# Patient Record
Sex: Female | Born: 1960 | Race: White | Hispanic: No | Smoking: Never smoker
Health system: Southern US, Community
[De-identification: ages and names within clinical notes are randomized; demographics above are authoritative.]

## PROBLEM LIST (undated history)

## (undated) DIAGNOSIS — F419 Anxiety disorder, unspecified: Secondary | ICD-10-CM

## (undated) DIAGNOSIS — Z808 Family history of malignant neoplasm of other organs or systems: Secondary | ICD-10-CM

## (undated) DIAGNOSIS — Z8489 Family history of other specified conditions: Secondary | ICD-10-CM

## (undated) DIAGNOSIS — Z8042 Family history of malignant neoplasm of prostate: Secondary | ICD-10-CM

## (undated) DIAGNOSIS — Z923 Personal history of irradiation: Secondary | ICD-10-CM

## (undated) DIAGNOSIS — E039 Hypothyroidism, unspecified: Secondary | ICD-10-CM

## (undated) HISTORY — DX: Family history of malignant neoplasm of prostate: Z80.42

## (undated) HISTORY — DX: Family history of malignant neoplasm of other organs or systems: Z80.8

---
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
## 1998-09-01 ENCOUNTER — Inpatient Hospital Stay (HOSPITAL_COMMUNITY): Admission: AD | Admit: 1998-09-01 | Discharge: 1998-09-03 | Payer: Self-pay | Admitting: Obstetrics and Gynecology

## 2001-02-26 ENCOUNTER — Other Ambulatory Visit: Admission: RE | Admit: 2001-02-26 | Discharge: 2001-02-26 | Payer: Self-pay | Admitting: Obstetrics and Gynecology

## 2002-01-20 ENCOUNTER — Encounter: Admission: RE | Admit: 2002-01-20 | Discharge: 2002-01-20 | Payer: Self-pay | Admitting: Obstetrics and Gynecology

## 2002-01-20 ENCOUNTER — Encounter: Payer: Self-pay | Admitting: Obstetrics and Gynecology

## 2002-03-18 HISTORY — PX: TUBAL LIGATION: SHX77

## 2002-09-03 ENCOUNTER — Ambulatory Visit (HOSPITAL_COMMUNITY): Admission: RE | Admit: 2002-09-03 | Discharge: 2002-09-03 | Payer: Self-pay | Admitting: Obstetrics and Gynecology

## 2003-05-17 ENCOUNTER — Encounter: Admission: RE | Admit: 2003-05-17 | Discharge: 2003-05-17 | Payer: Self-pay | Admitting: Obstetrics and Gynecology

## 2005-02-27 ENCOUNTER — Encounter: Admission: RE | Admit: 2005-02-27 | Discharge: 2005-02-27 | Payer: Self-pay | Admitting: Obstetrics and Gynecology

## 2006-03-27 ENCOUNTER — Encounter: Admission: RE | Admit: 2006-03-27 | Discharge: 2006-03-27 | Payer: Self-pay | Admitting: Obstetrics and Gynecology

## 2006-04-04 ENCOUNTER — Encounter: Admission: RE | Admit: 2006-04-04 | Discharge: 2006-04-04 | Payer: Self-pay | Admitting: Obstetrics and Gynecology

## 2006-10-06 ENCOUNTER — Encounter: Admission: RE | Admit: 2006-10-06 | Discharge: 2006-10-06 | Payer: Self-pay | Admitting: Obstetrics and Gynecology

## 2007-04-14 ENCOUNTER — Encounter: Admission: RE | Admit: 2007-04-14 | Discharge: 2007-04-14 | Payer: Self-pay | Admitting: Obstetrics and Gynecology

## 2007-09-16 ENCOUNTER — Encounter: Admission: RE | Admit: 2007-09-16 | Discharge: 2007-09-16 | Payer: Self-pay | Admitting: Obstetrics and Gynecology

## 2008-04-14 ENCOUNTER — Encounter: Admission: RE | Admit: 2008-04-14 | Discharge: 2008-04-14 | Payer: Self-pay | Admitting: Obstetrics and Gynecology

## 2009-04-25 ENCOUNTER — Encounter: Admission: RE | Admit: 2009-04-25 | Discharge: 2009-04-25 | Payer: Self-pay | Admitting: Obstetrics and Gynecology

## 2010-04-08 ENCOUNTER — Encounter: Payer: Self-pay | Admitting: Obstetrics and Gynecology

## 2010-04-11 ENCOUNTER — Other Ambulatory Visit: Payer: Self-pay | Admitting: Obstetrics and Gynecology

## 2010-04-11 DIAGNOSIS — Z1239 Encounter for other screening for malignant neoplasm of breast: Secondary | ICD-10-CM

## 2010-05-07 ENCOUNTER — Ambulatory Visit
Admission: RE | Admit: 2010-05-07 | Discharge: 2010-05-07 | Disposition: A | Discharge status: Home / Self Care | Payer: BC Managed Care – PPO | Source: Ambulatory Visit | Attending: Obstetrics and Gynecology | Admitting: Obstetrics and Gynecology

## 2010-05-07 DIAGNOSIS — Z1239 Encounter for other screening for malignant neoplasm of breast: Secondary | ICD-10-CM

## 2010-08-03 NOTE — H&P (Signed)
   NAMERAENA, PAU                       ACCOUNT NO.:  [id number]   MEDICAL RECORD NO.:  [id number]                   PATIENT TYPE:  AMB   LOCATION:  [facility]                                  FACILITY:  [facility]   PHYSICIAN:  Lenoard Aden, M.D.             DATE OF BIRTH:  31-Aug-1960   DATE OF ADMISSION:  09/03/2002  DATE OF DISCHARGE:                                HISTORY & PHYSICAL   CHIEF COMPLAINT:  Desire for elective sterilization.   HISTORY OF PRESENT ILLNESS:  This is a 50 year old, white female, G1, P1 who  desires elective sterilization.   PAST MEDICAL HISTORY:  1. Spontaneous vaginal delivery x1.  2. History of knee surgery in 1979.   ALLERGIES:  No known drug allergies.   MEDICATIONS:  None.   FAMILY HISTORY:  Noncontributory.   SOCIAL HISTORY:  Noncontributory.  Denies physical violence.   PHYSICAL EXAMINATION:  GENERAL:  She is a well-developed, well-nourished,  white female in no acute distress.  HEENT:  Normal.  LUNGS:  Clear.  HEART:  Regular rhythm.  ABDOMEN:  Soft, nontender.  PELVIC:  Small uterus and no adnexal masses.  EXTREMITIES:  No cords.  NEUROLOGIC:  Nonfocal.   IMPRESSION:  Desire for elective sterilization.   PLAN:  Proceed with a laparoscopic tubal ligation.  Risks of anesthesia,  infection, bleeding, injury to abdominal organs and need for repairs was  discussed.  Delayed versus immediate complications to include bowel and  bladder injury noted, risk of tubal ligation.  The patient acknowledges and  wishes to proceed.                                               Lenoard Aden, M.D.    RJT/MEDQ  D:  09/02/2002  T:  09/03/2002  Job:  [id number]

## 2010-08-03 NOTE — Op Note (Signed)
   NAMEARDA, DAGGS                       ACCOUNT NO.:  [id number]   MEDICAL RECORD NO.:  [id number]                   PATIENT TYPE:  AMB   LOCATION:  [facility]                                  FACILITY:  [facility]   PHYSICIAN:  Lenoard Aden, M.D.             DATE OF BIRTH:  1960-10-04   DATE OF PROCEDURE:  09/03/2002  DATE OF DISCHARGE:                                 OPERATIVE REPORT   PREOPERATIVE DIAGNOSIS:  Elective tubal ligation.   POSTOPERATIVE DIAGNOSIS:  Elective tubal ligation.   PROCEDURE:  Laparoscopic tubal ligation.   SURGEON:  Lenoard Aden, M.D.   ANESTHESIA:  General.   ESTIMATED BLOOD LOSS:  Less than 50 mL.   COMPLICATIONS:  None.   DRAINS:  None.   COUNTS:  Correct.   Patient to recovery room in good condition.   BRIEF OPERATIVE NOTE:  After being apprised of the risks of anesthesia,  infection, bleeding, failure risk of tubal ligation of five to 10 per  thousand, the patient is brought to the operating room after consents are  signed, prepped and draped in the usual sterile fashion.  After achieving  general anesthesia, examination under anesthesia reveals a mid positioned  uterus, no adnexal masses.  Hulka tenaculum placed per vagina,  infraumbilical incision made with a scalpel after placement of 10 mL of a  dilute Marcaine solution.  Veress needle placed.  Opening pressure of -2  noted.  Five liters of CO2 insufflated without difficulty, trocar placed and  atraumatic trocar placement visualized, pictures taken, normal liver,  gallbladder bed, normal appendiceal bed visualized, pictures taken, normal  tubes, normal ovaries, normal anterior and posterior cul-de-sac noted,  pictures taken.  Right tube traced out to the fimbriated end, cauterized  using Kleppinger bipolar cautery down to a resistance of 0 on three  contiguous segments.  The same procedure done on the left tube, both tubes  divided using hook scissors, tubal lumens visualized.   Marcaine solution  sprayed on both tubes approximately 10 mL total.  Good hemostasis noted.  CO2 released.  All instruments removed under direct visualization and  closure of abdominal incision using 0 Vicryl and Dermabond, removal of Hulka  tenaculum___________ is performed.  The patient tolerates the procedure  well, to recovery in good condition.                                               Lenoard Aden, M.D.   RJT/MEDQ  D:  09/03/2002  T:  09/03/2002  Job:  [id number]

## 2011-03-27 ENCOUNTER — Other Ambulatory Visit: Payer: Self-pay | Admitting: Obstetrics and Gynecology

## 2011-03-27 DIAGNOSIS — Z1231 Encounter for screening mammogram for malignant neoplasm of breast: Secondary | ICD-10-CM

## 2011-05-09 ENCOUNTER — Ambulatory Visit: Payer: BC Managed Care – PPO

## 2011-05-30 ENCOUNTER — Ambulatory Visit
Admission: RE | Admit: 2011-05-30 | Discharge: 2011-05-30 | Disposition: A | Discharge status: Home / Self Care | Payer: BC Managed Care – PPO | Source: Ambulatory Visit | Attending: Obstetrics and Gynecology | Admitting: Obstetrics and Gynecology

## 2011-05-30 DIAGNOSIS — Z1231 Encounter for screening mammogram for malignant neoplasm of breast: Secondary | ICD-10-CM

## 2012-03-20 ENCOUNTER — Other Ambulatory Visit: Payer: Self-pay | Admitting: Obstetrics and Gynecology

## 2012-03-20 DIAGNOSIS — Z1231 Encounter for screening mammogram for malignant neoplasm of breast: Secondary | ICD-10-CM

## 2012-06-01 ENCOUNTER — Ambulatory Visit
Admission: RE | Admit: 2012-06-01 | Discharge: 2012-06-01 | Disposition: A | Discharge status: Home / Self Care | Payer: BC Managed Care – PPO | Source: Ambulatory Visit | Attending: Obstetrics and Gynecology | Admitting: Obstetrics and Gynecology

## 2012-06-01 DIAGNOSIS — Z1231 Encounter for screening mammogram for malignant neoplasm of breast: Secondary | ICD-10-CM

## 2013-04-08 ENCOUNTER — Other Ambulatory Visit: Payer: Self-pay

## 2013-04-08 DIAGNOSIS — Z1231 Encounter for screening mammogram for malignant neoplasm of breast: Secondary | ICD-10-CM

## 2013-06-02 ENCOUNTER — Ambulatory Visit
Admission: RE | Admit: 2013-06-02 | Discharge: 2013-06-02 | Disposition: A | Discharge status: Home / Self Care | Payer: BC Managed Care – PPO | Source: Ambulatory Visit

## 2013-06-02 DIAGNOSIS — Z1231 Encounter for screening mammogram for malignant neoplasm of breast: Secondary | ICD-10-CM

## 2014-05-18 ENCOUNTER — Other Ambulatory Visit: Payer: Self-pay

## 2014-05-18 DIAGNOSIS — Z1231 Encounter for screening mammogram for malignant neoplasm of breast: Secondary | ICD-10-CM

## 2014-06-07 ENCOUNTER — Other Ambulatory Visit: Payer: Self-pay | Admitting: Obstetrics and Gynecology

## 2014-06-07 ENCOUNTER — Ambulatory Visit
Admission: RE | Admit: 2014-06-07 | Discharge: 2014-06-07 | Disposition: A | Discharge status: Home / Self Care | Payer: BLUE CROSS/BLUE SHIELD | Source: Ambulatory Visit

## 2014-06-07 DIAGNOSIS — R928 Other abnormal and inconclusive findings on diagnostic imaging of breast: Secondary | ICD-10-CM

## 2014-06-07 DIAGNOSIS — Z1231 Encounter for screening mammogram for malignant neoplasm of breast: Secondary | ICD-10-CM

## 2014-06-09 ENCOUNTER — Ambulatory Visit
Admission: RE | Admit: 2014-06-09 | Discharge: 2014-06-09 | Disposition: A | Discharge status: Home / Self Care | Payer: BLUE CROSS/BLUE SHIELD | Source: Ambulatory Visit | Attending: Obstetrics and Gynecology | Admitting: Obstetrics and Gynecology

## 2014-06-09 DIAGNOSIS — R928 Other abnormal and inconclusive findings on diagnostic imaging of breast: Secondary | ICD-10-CM

## 2015-08-21 DIAGNOSIS — N915 Oligomenorrhea, unspecified: Secondary | ICD-10-CM | POA: Diagnosis not present

## 2015-10-06 DIAGNOSIS — Z13220 Encounter for screening for lipoid disorders: Secondary | ICD-10-CM | POA: Diagnosis not present

## 2015-10-06 DIAGNOSIS — Z1211 Encounter for screening for malignant neoplasm of colon: Secondary | ICD-10-CM | POA: Diagnosis not present

## 2015-10-06 DIAGNOSIS — Z0000 Encounter for general adult medical examination without abnormal findings: Secondary | ICD-10-CM | POA: Diagnosis not present

## 2016-06-25 DIAGNOSIS — Z1231 Encounter for screening mammogram for malignant neoplasm of breast: Secondary | ICD-10-CM | POA: Diagnosis not present

## 2016-06-25 DIAGNOSIS — Z6834 Body mass index (BMI) 34.0-34.9, adult: Secondary | ICD-10-CM | POA: Diagnosis not present

## 2016-06-25 DIAGNOSIS — Z01419 Encounter for gynecological examination (general) (routine) without abnormal findings: Secondary | ICD-10-CM | POA: Diagnosis not present

## 2017-05-01 DIAGNOSIS — H8309 Labyrinthitis, unspecified ear: Secondary | ICD-10-CM | POA: Diagnosis not present

## 2017-07-11 DIAGNOSIS — Z1231 Encounter for screening mammogram for malignant neoplasm of breast: Secondary | ICD-10-CM | POA: Diagnosis not present

## 2017-07-11 DIAGNOSIS — Z6835 Body mass index (BMI) 35.0-35.9, adult: Secondary | ICD-10-CM | POA: Diagnosis not present

## 2017-07-11 DIAGNOSIS — Z01419 Encounter for gynecological examination (general) (routine) without abnormal findings: Secondary | ICD-10-CM | POA: Diagnosis not present

## 2017-07-11 DIAGNOSIS — Z1151 Encounter for screening for human papillomavirus (HPV): Secondary | ICD-10-CM | POA: Diagnosis not present

## 2017-12-28 DIAGNOSIS — H1033 Unspecified acute conjunctivitis, bilateral: Secondary | ICD-10-CM | POA: Diagnosis not present

## 2018-12-31 DIAGNOSIS — Z23 Encounter for immunization: Secondary | ICD-10-CM | POA: Diagnosis not present

## 2020-02-07 DIAGNOSIS — Z1231 Encounter for screening mammogram for malignant neoplasm of breast: Secondary | ICD-10-CM | POA: Diagnosis not present

## 2020-02-07 DIAGNOSIS — Z6834 Body mass index (BMI) 34.0-34.9, adult: Secondary | ICD-10-CM | POA: Diagnosis not present

## 2020-02-07 DIAGNOSIS — Z1151 Encounter for screening for human papillomavirus (HPV): Secondary | ICD-10-CM | POA: Diagnosis not present

## 2020-02-07 DIAGNOSIS — Z01419 Encounter for gynecological examination (general) (routine) without abnormal findings: Secondary | ICD-10-CM | POA: Diagnosis not present

## 2020-02-17 ENCOUNTER — Other Ambulatory Visit: Payer: Self-pay | Admitting: Obstetrics and Gynecology

## 2020-02-17 DIAGNOSIS — R921 Mammographic calcification found on diagnostic imaging of breast: Secondary | ICD-10-CM

## 2020-02-17 DIAGNOSIS — R928 Other abnormal and inconclusive findings on diagnostic imaging of breast: Secondary | ICD-10-CM

## 2020-03-02 ENCOUNTER — Other Ambulatory Visit: Payer: Self-pay | Admitting: Obstetrics and Gynecology

## 2020-03-02 ENCOUNTER — Other Ambulatory Visit: Payer: Self-pay

## 2020-03-02 ENCOUNTER — Ambulatory Visit
Admission: RE | Admit: 2020-03-02 | Discharge: 2020-03-02 | Disposition: A | Discharge status: Home / Self Care | Payer: BC Managed Care – PPO | Source: Ambulatory Visit | Attending: Obstetrics and Gynecology | Admitting: Obstetrics and Gynecology

## 2020-03-02 DIAGNOSIS — R928 Other abnormal and inconclusive findings on diagnostic imaging of breast: Secondary | ICD-10-CM

## 2020-03-02 DIAGNOSIS — R921 Mammographic calcification found on diagnostic imaging of breast: Secondary | ICD-10-CM | POA: Diagnosis not present

## 2020-03-21 ENCOUNTER — Ambulatory Visit
Admission: RE | Admit: 2020-03-21 | Discharge: 2020-03-21 | Disposition: A | Discharge status: Home / Self Care | Payer: BC Managed Care – PPO | Source: Ambulatory Visit | Attending: Obstetrics and Gynecology | Admitting: Obstetrics and Gynecology

## 2020-03-21 ENCOUNTER — Other Ambulatory Visit: Payer: Self-pay

## 2020-03-21 DIAGNOSIS — N6092 Unspecified benign mammary dysplasia of left breast: Secondary | ICD-10-CM | POA: Diagnosis not present

## 2020-03-21 DIAGNOSIS — R928 Other abnormal and inconclusive findings on diagnostic imaging of breast: Secondary | ICD-10-CM

## 2020-03-21 DIAGNOSIS — C50919 Malignant neoplasm of unspecified site of unspecified female breast: Secondary | ICD-10-CM

## 2020-03-21 DIAGNOSIS — R921 Mammographic calcification found on diagnostic imaging of breast: Secondary | ICD-10-CM | POA: Diagnosis not present

## 2020-03-21 DIAGNOSIS — C50412 Malignant neoplasm of upper-outer quadrant of left female breast: Secondary | ICD-10-CM | POA: Diagnosis not present

## 2020-03-21 HISTORY — DX: Malignant neoplasm of unspecified site of unspecified female breast: C50.919

## 2020-03-21 HISTORY — PX: BREAST BIOPSY: SHX20

## 2020-03-23 ENCOUNTER — Other Ambulatory Visit: Payer: Self-pay | Admitting: Obstetrics and Gynecology

## 2020-03-23 DIAGNOSIS — C50912 Malignant neoplasm of unspecified site of left female breast: Secondary | ICD-10-CM

## 2020-03-30 ENCOUNTER — Other Ambulatory Visit: Payer: Self-pay

## 2020-03-30 ENCOUNTER — Ambulatory Visit
Admission: RE | Admit: 2020-03-30 | Discharge: 2020-03-30 | Disposition: A | Discharge status: Home / Self Care | Payer: BC Managed Care – PPO | Source: Ambulatory Visit | Attending: Obstetrics and Gynecology | Admitting: Obstetrics and Gynecology

## 2020-03-30 DIAGNOSIS — C50912 Malignant neoplasm of unspecified site of left female breast: Secondary | ICD-10-CM | POA: Diagnosis not present

## 2020-04-04 ENCOUNTER — Ambulatory Visit: Payer: Self-pay | Admitting: General Surgery

## 2020-04-04 DIAGNOSIS — C50412 Malignant neoplasm of upper-outer quadrant of left female breast: Secondary | ICD-10-CM

## 2020-04-04 DIAGNOSIS — Z170 Estrogen receptor positive status [ER+]: Secondary | ICD-10-CM | POA: Diagnosis not present

## 2020-04-04 DIAGNOSIS — N6092 Unspecified benign mammary dysplasia of left breast: Secondary | ICD-10-CM

## 2020-04-05 ENCOUNTER — Other Ambulatory Visit: Payer: Self-pay | Admitting: General Surgery

## 2020-04-05 ENCOUNTER — Telehealth: Payer: Self-pay | Admitting: Hematology and Oncology

## 2020-04-05 DIAGNOSIS — C50412 Malignant neoplasm of upper-outer quadrant of left female breast: Secondary | ICD-10-CM

## 2020-04-05 NOTE — Telephone Encounter (Signed)
Received referrals from [facility] for genetics and medonc for a new dx of breast cancer. Nicole Potter has been cld and scheduled to see Dr. Lindi Adie on 1/24 at 345pm and genetics via mychart w/[facility] on 1/31 at 1pm.

## 2020-04-07 ENCOUNTER — Other Ambulatory Visit: Payer: Self-pay

## 2020-04-07 ENCOUNTER — Ambulatory Visit
Admission: RE | Admit: 2020-04-07 | Discharge: 2020-04-07 | Disposition: A | Discharge status: Home / Self Care | Payer: BC Managed Care – PPO | Source: Ambulatory Visit | Attending: General Surgery | Admitting: General Surgery

## 2020-04-07 DIAGNOSIS — N6489 Other specified disorders of breast: Secondary | ICD-10-CM | POA: Diagnosis not present

## 2020-04-07 DIAGNOSIS — C50412 Malignant neoplasm of upper-outer quadrant of left female breast: Secondary | ICD-10-CM

## 2020-04-07 MED ORDER — GADOBUTROL 1 MMOL/ML IV SOLN
10.0000 mL | Freq: Once | INTRAVENOUS | Status: AC | PRN
  Administered 2020-04-07: 10 mL via INTRAVENOUS

## 2020-04-09 NOTE — Progress Notes (Signed)
[facility] CONSULT NOTE  Patient Care Team: Lujean Amel, MD as PCP - General (Family Medicine) Mauro Kaufmann, RN as Oncology Nurse Navigator Rockwell Germany, RN as Oncology Nurse Navigator  CHIEF COMPLAINTS/PURPOSE OF CONSULTATION:  Newly diagnosed breast cancer  HISTORY OF PRESENTING ILLNESS:  Nicole Potter 60 y.o. female is here because of recent diagnosis of invasive mammary carcinoma of the left breast. Screening mammogram detected left breast calcifications. Diagnostic mammogram on 03/02/20 showed two 0.6cm groups of calcifications in the left breast, spanning 2.0cm in total. Biopsy on 03/21/20 showed invasive mammary carcinoma with lobular and ductal features, HER-2 negative (0), ER+ 40% weak, PR+ 40% moderate, Ki67 10%. She presents to the clinic today for initial evaluation and discussion of treatment options.   I reviewed her records extensively and collaborated the history with the patient.  SUMMARY OF ONCOLOGIC HISTORY: Oncology History  Malignant neoplasm of upper-outer quadrant of left breast in female, estrogen receptor positive ([place])  03/21/2020 Initial Diagnosis   Screening mammogram detected left breast calcifications. Diagnostic mammogram on 03/02/20 showed two 0.6cm groups of calcifications in the left breast, spanning 2.0cm in total. Biopsy on 03/21/20 showed invasive mammary carcinoma with lobular and ductal features, HER-2 negative (0), ER+ 40% weak, PR+ 40% moderate, Ki67 10%.     MEDICAL HISTORY:  Past Medical History:  Diagnosis Date  . Breast cancer ([place]) 03/21/2020   left breast INVASIVE MAMMARY CA    SURGICAL HISTORY: Knee surgery when she was a teenager and tubal ligation    SOCIAL HISTORY: Denies any tobacco use.  Occasional alcohol  FAMILY HISTORY: No family history of breast cancer  ALLERGIES:  has No Known Allergies.  MEDICATIONS:  No current outpatient medications on file.   No current facility-administered medications for  this visit.    REVIEW OF SYSTEMS:     All other systems were reviewed with the patient and are negative.  PHYSICAL EXAMINATION: ECOG PERFORMANCE STATUS: 1 - Symptomatic but completely ambulatory  Vitals:   04/10/20 1552  BP: (!) 115/56  Pulse: (!) 103  Resp: 18  Temp: 99 F (37.2 C)  SpO2: 98%   Filed Weights   04/10/20 1552  Weight: 221 lb 12.8 oz (100.6 kg)     RADIOGRAPHIC STUDIES: I have personally reviewed the radiological reports and agreed with the findings in the report.  ASSESSMENT AND PLAN:  Malignant neoplasm of upper-outer quadrant of left breast in female, estrogen receptor positive ([place]) 03/21/2020:Screening mammogram detected left breast calcifications. Diagnostic mammogram on 03/02/20 showed two 0.6cm groups of calcifications in the left breast, spanning 2.0cm in total. Biopsy on 03/21/20 showed invasive mammary carcinoma with lobular and ductal features, HER-2 negative (0), ER+ 40% weak, PR+ 40% moderate, Ki67 10%.  Pathology and radiology counseling:Discussed with the patient, the details of pathology including the type of breast cancer,the clinical staging, the significance of ER, PR and HER-2/neu receptors and the implications for treatment. After reviewing the pathology in detail, we proceeded to discuss the different treatment options between surgery, radiation, chemotherapy, antiestrogen therapies. For some reason grade was not mentioned in the pathology report.  Recommendations: 1. Breast conserving surgery followed by 2. Oncotype DX testing depending on the final tumor grade in size 3. Adjuvant radiation therapy followed by 4. Adjuvant antiestrogen therapy  Oncotype counseling: I discussed Oncotype DX test. I explained to the patient that this is a 21 gene panel to evaluate patient tumors DNA to calculate recurrence score. This would help determine whether patient  has high risk or low risk breast cancer. She understands that if her tumor was found to be  high risk, she would benefit from systemic chemotherapy. If low risk, no need of chemotherapy.  Return to clinic after surgery to discuss final pathology report and then determine if Oncotype DX testing will need to be sent.  All questions were answered. The patient knows to call the clinic with any problems, questions or concerns.   Rulon Eisenmenger, MD, MPH 04/10/2020    I, [name], am acting as scribe for Nicholas Lose, MD.  I have reviewed the above documentation for accuracy and completeness, and I agree with the above.

## 2020-04-10 ENCOUNTER — Inpatient Hospital Stay: Payer: BC Managed Care – PPO | Attending: Hematology and Oncology | Admitting: Hematology and Oncology

## 2020-04-10 ENCOUNTER — Other Ambulatory Visit: Payer: Self-pay

## 2020-04-10 DIAGNOSIS — Z170 Estrogen receptor positive status [ER+]: Secondary | ICD-10-CM | POA: Diagnosis not present

## 2020-04-10 DIAGNOSIS — C50412 Malignant neoplasm of upper-outer quadrant of left female breast: Secondary | ICD-10-CM

## 2020-04-10 NOTE — Assessment & Plan Note (Signed)
03/21/2020:Screening mammogram detected left breast calcifications. Diagnostic mammogram on 03/02/20 showed two 0.6cm groups of calcifications in the left breast, spanning 2.0cm in total. Biopsy on 03/21/20 showed invasive mammary carcinoma with lobular and ductal features, HER-2 negative (0), ER+ 40% weak, PR+ 40% moderate, Ki67 10%.  Pathology and radiology counseling:Discussed with the patient, the details of pathology including the type of breast cancer,the clinical staging, the significance of ER, PR and HER-2/neu receptors and the implications for treatment. After reviewing the pathology in detail, we proceeded to discuss the different treatment options between surgery, radiation, chemotherapy, antiestrogen therapies.  Recommendations: 1. Breast conserving surgery followed by 2. Oncotype DX testing to determine if chemotherapy would be of any benefit followed by 3. Adjuvant radiation therapy followed by 4. Adjuvant antiestrogen therapy  Oncotype counseling: I discussed Oncotype DX test. I explained to the patient that this is a 21 gene panel to evaluate patient tumors DNA to calculate recurrence score. This would help determine whether patient has high risk or low risk breast cancer. She understands that if her tumor was found to be high risk, she would benefit from systemic chemotherapy. If low risk, no need of chemotherapy.  Return to clinic after surgery to discuss final pathology report and then determine if Oncotype DX testing will need to be sent.

## 2020-04-10 NOTE — Progress Notes (Incomplete)
Location of Breast Cancer: Malignant neoplasm of UOQ Left breast  Did patient present with symptoms (if so, please note symptoms) or was this found on screening mammography?: Routine mammogram. Patient did not notice any changes in the breast, no nipple discharge, no pain or tenderness.  MRI Breast 04/07/2020: Post biopsy changes in the UOQ of the left breast.  No additional enhancement in the left breast.  Right breast is negative.  [place] Axilla 03/30/2020: No lymphadenopathy on the left.  Diagnostic Mammogram 03/02/2020: Two 6 mm areas of calcifications adjacent to each other in the upper outer quadrant of the left breast measuring a total of 2 cm.  Lymph nodes looked normal.  Histology per Pathology Report: Left Breast 03/21/2020  Receptor Status: ER(+40%), PR (+40%), Her2-neu (-), Ki-67(10%)    Past/Anticipated interventions by surgeon, if any: Dr. Marlou Starks 04/04/2020 -I have discussed with her the different options for treatment and at this point she is favoring breast conservation which I believe is a very reasonable way of treating the cancer. -She is also a good candidate for sentinel node biopsy. -I will go ahead a refer her to medical and radiation oncology to discuss adjuvant therapy. -Since the cancer is lobular nature we will also try to get an MRI study of both breasts. -I will plan for a left breast radioactive seed localized lumpectomy x2 and sentinel node biopsy. -Left Breast lumpectomy with radioactive seed x2 and SLN biopsy 04/24/2020  Past/Anticipated interventions by medical oncology, if any: Chemotherapy  Dr. Lindi Adie 04/10/2020 3:45 pm Recommendations: 1. Breast conserving surgery followed by 2. Oncotype DX testing to determine if chemotherapy would be of any benefit followed by 3. Adjuvant radiation therapy followed by 4. Adjuvant antiestrogen therapy  Lymphedema issues, if any:  No  Pain issues, if any:  None now.  SAFETY ISSUES:  Prior radiation? No  Pacemaker/ICD?  No  Possible current pregnancy? No  Is the patient on methotrexate? No  Current Complaints / other details:   Genetics- Mychart 04/17/2020     Cori Razor, RN 04/10/2020,8:20 AM

## 2020-04-11 ENCOUNTER — Other Ambulatory Visit: Payer: Self-pay | Admitting: General Surgery

## 2020-04-11 ENCOUNTER — Other Ambulatory Visit: Payer: Self-pay

## 2020-04-11 ENCOUNTER — Ambulatory Visit
Admission: RE | Admit: 2020-04-11 | Discharge: 2020-04-11 | Disposition: A | Discharge status: Home / Self Care | Payer: BC Managed Care – PPO | Source: Ambulatory Visit | Attending: Radiation Oncology | Admitting: Radiation Oncology

## 2020-04-11 ENCOUNTER — Encounter: Payer: Self-pay | Admitting: *Deleted

## 2020-04-11 ENCOUNTER — Telehealth: Payer: Self-pay | Admitting: *Deleted

## 2020-04-11 ENCOUNTER — Encounter: Payer: Self-pay | Admitting: Radiation Oncology

## 2020-04-11 VITALS — BP 125/69 | HR 89 | Temp 97.8°F | Resp 18 | Ht 69.5 in | Wt 221.2 lb

## 2020-04-11 DIAGNOSIS — C50412 Malignant neoplasm of upper-outer quadrant of left female breast: Secondary | ICD-10-CM

## 2020-04-11 DIAGNOSIS — Z170 Estrogen receptor positive status [ER+]: Secondary | ICD-10-CM | POA: Diagnosis not present

## 2020-04-11 DIAGNOSIS — Z79899 Other long term (current) drug therapy: Secondary | ICD-10-CM | POA: Diagnosis not present

## 2020-04-11 DIAGNOSIS — Z808 Family history of malignant neoplasm of other organs or systems: Secondary | ICD-10-CM | POA: Insufficient documentation

## 2020-04-11 NOTE — Progress Notes (Signed)
.  Radiation Oncology         ([phone]) [phone] ________________________________  Name: Nicole Potter        MRN: [id number]  Date of Service: 04/11/2020 DOB: 26-Apr-1960  [place]:[id number], [place], MD  Jovita Kussmaul, MD     REFERRING PHYSICIAN: Autumn Messing III, MD   DIAGNOSIS: The encounter diagnosis was Malignant neoplasm of upper-outer quadrant of left breast in female, estrogen receptor positive ([place]).   HISTORY OF PRESENT ILLNESS: Nicole Potter is a 60 y.o. female seen in the multidisciplinary breast clinic for a new diagnosis of left breast cancer. The patient was noted to have a screening detected group of calcifications, there were 2 separate groups but overall measured approximately 2 cm on diagnostic mammography and confirmed no adenopathy in the axilla.  She underwent a biopsy on 03/21/2020 which showed an invasive mammary carcinoma with lobular and ductal features as well as noninvasive features as well as atypical lobular hyperplasia and calcifications, the cancer was ER/PR positive HER-2 was negative and Ki-67 was 10%, grade was not reported.  A second biopsy of the left breast showed complex sclerosing lesion with flat epithelial atypia and lobular neoplasia and calcifications.  She has undergone an MRI of the breast on 04/07/2020 and final results show no mass in the right breast or either axilla and within the left upper outer quadrant there were postbiopsy changes and no new findings.  She is scheduled to undergo lumpectomy of the left breast with sentinel lymph node biopsy on 04/24/2020 and is seen today to discuss treatment recommendations following surgery.     PREVIOUS RADIATION THERAPY: No   PAST MEDICAL HISTORY:  Past Medical History:  Diagnosis Date  . Breast cancer ([place]) 03/21/2020   left breast INVASIVE MAMMARY CA       PAST SURGICAL HISTORY: Past Surgical History:  Procedure Laterality Date  . BREAST BIOPSY Left 03/21/2020     FAMILY HISTORY:  Family History  Problem  Relation Age of Onset  . Thyroid cancer Mother      SOCIAL HISTORY:  reports that she has never smoked. She has never used smokeless tobacco. She reports current alcohol use. She reports that she does not use drugs. The patient is accompanied by her daughter and lives in [place]. She works for a [employer] in [employer]. She's been able to work remotely from home during the pandemic.   ALLERGIES: Patient has no known allergies.   MEDICATIONS:  Current Outpatient Medications  Medication Sig Dispense Refill  . ALPRAZolam (XANAX) 0.5 MG tablet Take 0.5 mg by mouth daily as needed.    . liraglutide (VICTOZA) 18 MG/3ML SOPN INJECT 1.8MG (0.3 ML) SUBCUTANEOUSLY EVERY DAY    . Multiple Vitamin (MULTIVITAMIN WITH MINERALS) TABS tablet Take 1 tablet by mouth daily.    . NP THYROID 90 MG tablet Take 90 mg by mouth at bedtime.    . phentermine 30 MG capsule Take 30 mg by mouth every morning.     No current facility-administered medications for this encounter.     REVIEW OF SYSTEMS: On review of systems, the patient reports that she is doing well overall.  She denies any specific complaints of the breast.  She is ready to move forward with her surgery.     PHYSICAL EXAM:  Wt Readings from Last 3 Encounters:  04/11/20 221 lb 3.2 oz (100.3 kg)  04/10/20 221 lb 12.8 oz (100.6 kg)   Temp Readings from Last 3 Encounters:  04/11/20  97.8 F (36.6 C)  04/10/20 99 F (37.2 C) (Temporal)   BP Readings from Last 3 Encounters:  04/11/20 125/69  04/10/20 (!) 115/56   Pulse Readings from Last 3 Encounters:  04/11/20 89  04/10/20 (!) 103    In general this is a well appearing Caucasian female in no acute distress. She's alert and oriented x4 and appropriate throughout the examination. Cardiopulmonary assessment is negative for acute distress and she exhibits normal effort. Bilateral breast exam is deferred.    ECOG = 0  0 - Asymptomatic (Fully active, able to carry on  all predisease activities without restriction)  1 - Symptomatic but completely ambulatory (Restricted in physically strenuous activity but ambulatory and able to carry out work of a light or sedentary nature. For example, light housework, office work)  2 - Symptomatic, <50% in bed during the day (Ambulatory and capable of all self care but unable to carry out any work activities. Up and about more than 50% of waking hours)  3 - Symptomatic, >50% in bed, but not bedbound (Capable of only limited self-care, confined to bed or chair 50% or more of waking hours)  4 - Bedbound (Completely disabled. Cannot carry on any self-care. Totally confined to bed or chair)  5 - Death   Eustace Pen MM, Creech RH, Tormey DC, et al. [phone]). "Toxicity and response criteria of the [facility]". [place] Oncol. 5 (6): 649-55    LABORATORY DATA:  No results found for: WBC, HGB, HCT, MCV, PLT No results found for: NA, K, CL, CO2 No results found for: ALT, AST, GGT, ALKPHOS, BILITOT    RADIOGRAPHY: MR BREAST BILATERAL W WO CONTRAST INC CAD  Result Date: 04/10/2020 CLINICAL DATA:  Recent stereotactic guided core biopsy of 2 sites of calcifications in the UPPER-OUTER QUADRANT of the LEFT breast shows complex sclerosing lesion with flat epithelial atypia and lobular neoplasia (atypical lobular hyperplasia) at site 1 and invasive mammary carcinoma with lobular and ductal features, mammary carcinoma in situ, lobular neoplasia (atypical lobular hyperplasia) at the second site. LABS:  None obtained at the time of imaging. EXAM: BILATERAL BREAST MRI WITH AND WITHOUT CONTRAST TECHNIQUE: Multiplanar, multisequence MR images of both breasts were obtained prior to and following the intravenous administration of 10 ml of Gadavist Three-dimensional MR images were rendered by post-processing of the original MR data on an independent workstation. The three-dimensional MR images were interpreted, and findings are  reported in the following complete MRI report for this study. Three dimensional images were evaluated at the independent interpreting workstation using the DynaCAD thin client. COMPARISON:  Previous exam(s). FINDINGS: Breast composition: c. Heterogeneous fibroglandular tissue. Background parenchymal enhancement: Minimal Right breast: No mass or abnormal enhancement. Left breast: Within the UPPER OUTER QUADRANT, middle depth, there are post biopsy changes following recent stereotactic guided core biopsies of calcifications. Tissue marker clip artifacts are associated minimal rim enhancement of the biopsy tracts. There is no significant additional enhancement in the LEFT breast. Lymph nodes: No abnormal appearing lymph nodes. Ancillary findings:  None. IMPRESSION: 1. Post biopsy changes in the UPPER OUTER QUADRANT of the LEFT breast. 2. No additional enhancement in the LEFT breast. 3. RIGHT breast is negative. RECOMMENDATION: Treatment plan for known LEFT breast malignancy. BI-RADS CATEGORY  6: Known biopsy-proven malignancy. Electronically Signed   By: Nolon Nations M.D.   On: 04/10/2020 11:59   [place] AXILLA LEFT  Result Date: 03/30/2020 CLINICAL DATA:  Recent diagnosis of left breast cancer. Ultrasound of left  axilla. EXAM: ULTRASOUND OF THE LEFT AXILLA COMPARISON:  Previous exam(s). FINDINGS: On physical exam,no suspicious lumps are identified. Ultrasound is performed, showing normal lymph nodes in the left axilla. IMPRESSION: No lymphadenopathy on the left. RECOMMENDATION: Continued surgical and oncologic follow up. I have discussed the findings and recommendations with the patient. If applicable, a reminder letter will be sent to the patient regarding the next appointment. BI-RADS CATEGORY  1: Negative. Electronically Signed   By: Dorise Bullion III M.D   On: 03/30/2020 13:06   MM CLIP PLACEMENT LEFT  Result Date: 03/21/2020 CLINICAL DATA:  Post biopsy mammogram of the left breast for clip placement.  EXAM: DIAGNOSTIC LEFT MAMMOGRAM POST STEREOTACTIC BIOPSY COMPARISON:  Previous exam(s). FINDINGS: Mammographic images were obtained following stereotactic guided biopsy of 2 groups of calcifications in the upper-outer left breast. The biopsy marking clips are in expected position at the sites of biopsy. IMPRESSION: Appropriate positioning of the coil and X shaped biopsy marking clips at the sites of biopsy in the upper-outer left breast. Final Assessment: Post Procedure Mammograms for Marker Placement Electronically Signed   By: Ammie Ferrier M.D.   On: 03/21/2020 10:14   MM LT BREAST BX W LOC DEV 1ST LESION IMAGE BX SPEC STEREO GUIDE  Addendum Date: 03/23/2020   ADDENDUM REPORT: 03/23/2020 13:37 ADDENDUM: Pathology revealed COMPLEX SCLEROSING LESION WITH FLAT EPITHELIAL ATYPIA AND LOBULAR NEOPLASIA (ATYPICAL LOBULAR HYPERPLASIA), CALCIFICATIONS of the Left breast, upper outer quadrant. This was found to be concordant by Dr. Ammie Ferrier, with excision recommended. Pathology revealed INVASIVE MAMMARY CARCINOMA WITH LOBULAR AND DUCTAL FEATURES, MAMMARY CARCINOMA IN SITU, LOBULAR NEOPLASIA (ATYPICAL LOBULAR HYPERPLASIA), CALCIFICATIONS of the Left breast, upper outer quadrant. The morphology is most consistent with invasive lobular; however, there are features of both ductal and lobular carcinoma. This was found to be concordant by Dr. Ammie Ferrier. Pathology results were discussed with the patient by telephone. The patient reported doing well after the biopsies with tenderness at the sites. Post biopsy instructions and care were reviewed and questions were answered. The patient was encouraged to call [facility] for any additional concerns. My direct phone number was provided. Surgical consultation has been arranged with Dr. Autumn Messing at [facility] on April 04, 2020. The patient is scheduled for a Left axillary ultrasound on March 31, 2019, due to the  invasive component of the diagnosis. Recommendation for a bilateral breast MRI given the lobular histology and heterogeneously dense breasts. Pathology results reported by Terie Purser, RN on 03/23/2020. Electronically Signed   By: Ammie Ferrier M.D.   On: 03/23/2020 13:37   Result Date: 03/23/2020 CLINICAL DATA:  60 year old female presenting for stereotactic biopsy of left breast calcifications. EXAM: LEFT BREAST STEREOTACTIC CORE NEEDLE BIOPSY COMPARISON:  Previous exams. FINDINGS: The patient and I discussed the procedure of stereotactic-guided biopsy including benefits and alternatives. We discussed the high likelihood of a successful procedure. We discussed the risks of the procedure including infection, bleeding, tissue injury, clip migration, and inadequate sampling. Informed written consent was given. The usual time out protocol was performed immediately prior to the procedure. Using sterile technique and 1% Lidocaine as local anesthetic, under stereotactic guidance, a 9 gauge vacuum assisted device was used to perform core needle biopsy of calcifications in the upper-outer quadrant of the left breast using a superior approach. Specimen radiograph was performed showing calcifications in multiple core samples. Specimens with calcifications are identified for pathology. Lesion quadrant: Upper outer quadrant At the conclusion of  the procedure, a coil shaped tissue marker clip was deployed into the biopsy cavity. ------------------------------------------------------------------------------------------------------------------ Using sterile technique and 1% Lidocaine as local anesthetic, under stereotactic guidance, a 9 gauge vacuum assisted device was used to perform core needle biopsy of calcifications in the upper-outer quadrant of the left breast using a superior approach. Specimen radiograph was performed showing calcifications in multiple core samples. Specimens with calcifications are identified for  pathology. Lesion quadrant: Upper outer quadrant At the conclusion of the procedure, an X shaped tissue marker clip was deployed into the biopsy cavity. Follow-up 2-view mammogram was performed and dictated separately. IMPRESSION: Stereotactic-guided biopsy of 2 sites of calcifications in the upper-outer quadrant of the left breast. No apparent complications. Electronically Signed: By: Ammie Ferrier M.D. On: 03/21/2020 10:09   MM LT BREAST BX W LOC DEV EA AD LESION IMG BX SPEC STEREO GUIDE  Addendum Date: 03/23/2020   ADDENDUM REPORT: 03/23/2020 13:37 ADDENDUM: Pathology revealed COMPLEX SCLEROSING LESION WITH FLAT EPITHELIAL ATYPIA AND LOBULAR NEOPLASIA (ATYPICAL LOBULAR HYPERPLASIA), CALCIFICATIONS of the Left breast, upper outer quadrant. This was found to be concordant by Dr. Ammie Ferrier, with excision recommended. Pathology revealed INVASIVE MAMMARY CARCINOMA WITH LOBULAR AND DUCTAL FEATURES, MAMMARY CARCINOMA IN SITU, LOBULAR NEOPLASIA (ATYPICAL LOBULAR HYPERPLASIA), CALCIFICATIONS of the Left breast, upper outer quadrant. The morphology is most consistent with invasive lobular; however, there are features of both ductal and lobular carcinoma. This was found to be concordant by Dr. Ammie Ferrier. Pathology results were discussed with the patient by telephone. The patient reported doing well after the biopsies with tenderness at the sites. Post biopsy instructions and care were reviewed and questions were answered. The patient was encouraged to call [facility] for any additional concerns. My direct phone number was provided. Surgical consultation has been arranged with Dr. Autumn Messing at [facility] on April 04, 2020. The patient is scheduled for a Left axillary ultrasound on March 31, 2019, due to the invasive component of the diagnosis. Recommendation for a bilateral breast MRI given the lobular histology and heterogeneously dense breasts. Pathology  results reported by Terie Purser, RN on 03/23/2020. Electronically Signed   By: Ammie Ferrier M.D.   On: 03/23/2020 13:37   Result Date: 03/23/2020 CLINICAL DATA:  60 year old female presenting for stereotactic biopsy of left breast calcifications. EXAM: LEFT BREAST STEREOTACTIC CORE NEEDLE BIOPSY COMPARISON:  Previous exams. FINDINGS: The patient and I discussed the procedure of stereotactic-guided biopsy including benefits and alternatives. We discussed the high likelihood of a successful procedure. We discussed the risks of the procedure including infection, bleeding, tissue injury, clip migration, and inadequate sampling. Informed written consent was given. The usual time out protocol was performed immediately prior to the procedure. Using sterile technique and 1% Lidocaine as local anesthetic, under stereotactic guidance, a 9 gauge vacuum assisted device was used to perform core needle biopsy of calcifications in the upper-outer quadrant of the left breast using a superior approach. Specimen radiograph was performed showing calcifications in multiple core samples. Specimens with calcifications are identified for pathology. Lesion quadrant: Upper outer quadrant At the conclusion of the procedure, a coil shaped tissue marker clip was deployed into the biopsy cavity. ------------------------------------------------------------------------------------------------------------------ Using sterile technique and 1% Lidocaine as local anesthetic, under stereotactic guidance, a 9 gauge vacuum assisted device was used to perform core needle biopsy of calcifications in the upper-outer quadrant of the left breast using a superior approach. Specimen radiograph was performed showing calcifications in multiple core samples. Specimens with  calcifications are identified for pathology. Lesion quadrant: Upper outer quadrant At the conclusion of the procedure, an X shaped tissue marker clip was deployed into the biopsy cavity.  Follow-up 2-view mammogram was performed and dictated separately. IMPRESSION: Stereotactic-guided biopsy of 2 sites of calcifications in the upper-outer quadrant of the left breast. No apparent complications. Electronically Signed: By: Ammie Ferrier M.D. On: 03/21/2020 10:09       IMPRESSION/PLAN: 1. 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 Dr. Lisbeth Renshaw discusses the pathology findings and reviews the nature of left breast disease. The consensus from the physicians includes breast conservation with lumpectomy with sentinel node biopsy. Dr. Lindi Adie anticipates Oncotype Dx score to determine a role for systemic therapy. Provided that chemotherapy is not indicated, the patient's course would then be followed by external radiotherapy to the breast  to reduce risks of local recurrence followed by antiestrogen therapy. We discussed the risks, benefits, short, and long term effects of radiotherapy, as well as the curative intent, and the patient is interested in proceeding. Dr. Lisbeth Renshaw discusses the delivery and logistics of radiotherapy and anticipates a course of 4 or 6 1/2 weeks of radiotherapy. We will see her back a few weeks after surgery to discuss the simulation process and anticipate we starting radiotherapy about 4-6 weeks after surgery.    In a visit lasting 60 minutes, greater than 50% of the time was spent face to face reviewing her case, as well as in preparation of, discussing, and coordinating the patient's care.  The above documentation reflects my direct findings during this shared patient visit. Please see the separate note by Dr. Lisbeth Renshaw on this date for the remainder of the patient's plan of care.    Carola Rhine, PAC

## 2020-04-11 NOTE — Telephone Encounter (Signed)
Spoke with patient to follow up from her new patient appt with Dr. Lindi Adie and assess navigation needs.  Contact information given. Encouraged her to call with questions or concerns.

## 2020-04-12 ENCOUNTER — Encounter: Payer: Self-pay | Admitting: Licensed Clinical Social Worker

## 2020-04-12 ENCOUNTER — Telehealth: Payer: Self-pay | Admitting: Hematology and Oncology

## 2020-04-12 NOTE — Progress Notes (Signed)
[place] Psychosocial Distress Screening Clinical Social Work  Clinical Social Work was referred by distress screening protocol.  The patient scored a 7 on the Psychosocial Distress Thermometer which indicates moderate distress. Clinical Social Worker contacted patient by phone to assess for distress and other psychosocial needs.   Patient reports doing well but a little overwhelmed by information and appointments recently. Did have two situations (biopsy, MRI) where she feels like things were not explained well.  Overall, she feels hopeful moving forward. Nicole Potter describes strong support from family, particularly her three sisters and her daughter. She is working remotely currently and does not have any financial or resource needs at this time.  CSW and patient discussed the importance of support during treatment.  CSW informed patient of the support team and support services at [facility].  CSW provided contact information and encouraged patient to call with any questions or concerns.  ONCBCN DISTRESS SCREENING 04/11/2020  Screening Type Initial Screening  Distress experienced in past week (1-10) 7  Practical problem type Insurance  Emotional problem type Nervousness/Anxiety;Adjusting to illness  Physical Problem type Sleep/insomnia  Other Contact via phone    Clinical Social Worker follow up needed: No.  If yes, follow up plan:  Nicole Potter General, LCSW

## 2020-04-12 NOTE — Telephone Encounter (Signed)
Scheduled follow-up appointment per 1/25 schedule message. Patient is aware. °

## 2020-04-17 ENCOUNTER — Encounter (HOSPITAL_BASED_OUTPATIENT_CLINIC_OR_DEPARTMENT_OTHER): Payer: Self-pay | Admitting: General Surgery

## 2020-04-17 ENCOUNTER — Encounter: Payer: Self-pay | Admitting: *Deleted

## 2020-04-17 ENCOUNTER — Inpatient Hospital Stay: Payer: BC Managed Care – PPO | Admitting: Genetic Counselor

## 2020-04-17 ENCOUNTER — Other Ambulatory Visit: Payer: Self-pay

## 2020-04-17 ENCOUNTER — Telehealth: Payer: Self-pay | Admitting: Genetic Counselor

## 2020-04-17 NOTE — Telephone Encounter (Signed)
Pt cld to reschedule her genetic counseling appt to 2/2 at 10am to see Santiago Glad via [facility] video visit.

## 2020-04-19 ENCOUNTER — Other Ambulatory Visit: Payer: Self-pay | Admitting: Genetic Counselor

## 2020-04-19 ENCOUNTER — Ambulatory Visit (HOSPITAL_BASED_OUTPATIENT_CLINIC_OR_DEPARTMENT_OTHER): Payer: BC Managed Care – PPO | Admitting: Genetic Counselor

## 2020-04-19 ENCOUNTER — Encounter: Payer: Self-pay | Admitting: Genetic Counselor

## 2020-04-19 DIAGNOSIS — C50412 Malignant neoplasm of upper-outer quadrant of left female breast: Secondary | ICD-10-CM

## 2020-04-19 DIAGNOSIS — Z170 Estrogen receptor positive status [ER+]: Secondary | ICD-10-CM | POA: Diagnosis not present

## 2020-04-19 DIAGNOSIS — Z8042 Family history of malignant neoplasm of prostate: Secondary | ICD-10-CM

## 2020-04-19 DIAGNOSIS — Z808 Family history of malignant neoplasm of other organs or systems: Secondary | ICD-10-CM

## 2020-04-19 NOTE — Progress Notes (Signed)
REFERRING PROVIDER: Nicholas Lose, MD [place],  [place] [address]  PRIMARY PROVIDER:  Lujean Amel, MD  PRIMARY REASON FOR VISIT:  1. Malignant neoplasm of upper-outer quadrant of left breast in female, estrogen receptor positive ([place])   2. Family history of prostate cancer   3. Family history of thyroid cancer      HISTORY OF PRESENT ILLNESS:  I connected with  Nicole Potter on 04/19/2020 at 10 AM EDT by MyChart video conference and verified that I am speaking with the correct person using two identifiers.   Patient location: Home Provider location: [facility]  Nicole Potter, a 60 y.o. female, was seen for a [place] cancer genetics consultation at the request of Dr. Lindi Adie due to a personal and family history of cancer.  Nicole Potter presents to clinic today to discuss the possibility of a hereditary predisposition to cancer, genetic testing, and to further clarify her future cancer risks, as well as potential cancer risks for family members.   In January 2022, at the age of 66, Nicole Potter was diagnosed with invasive lobular carcinoma of the left breast. The treatment plan includes surgery next week.   CANCER HISTORY:  Oncology History  Malignant neoplasm of upper-outer quadrant of left breast in female, estrogen receptor positive ([place])  03/21/2020 Initial Diagnosis   Screening mammogram detected left breast calcifications. Diagnostic mammogram on 03/02/20 showed two 0.6cm groups of calcifications in the left breast, spanning 2.0cm in total. Biopsy on 03/21/20 showed invasive mammary carcinoma with lobular and ductal features, HER-2 negative (0), ER+ 40% weak, PR+ 40% moderate, Ki67 10%.      RISK FACTORS:  Menarche was at age 88.  First live birth at age 82.    Ovaries intact: yes.  Hysterectomy: no.  Menopausal status: postmenopausal.  HRT use: 0 years. Colonoscopy: yes; normal. Mammogram within the last year: yes. Number of breast biopsies: 1. Up to date  with pelvic exams: yes. Any excessive radiation exposure in the past: no  Past Medical History:  Diagnosis Date  . Anxiety   . Breast cancer ([place]) 03/21/2020   left breast INVASIVE MAMMARY CA  . Family history of adverse reaction to anesthesia    mother is sensitive to anesthesia  . Family history of prostate cancer   . Family history of thyroid cancer   . Hypothyroidism     Past Surgical History:  Procedure Laterality Date  . BREAST BIOPSY Left 03/21/2020  . KNEE SURGERY Left 1980  . TUBAL LIGATION  2004    Social History   Socioeconomic History  . Marital status: Unknown    Spouse name: Not on file  . Number of children: Not on file  . Years of education: Not on file  . Highest education level: Not on file  Occupational History  . Not on file  Tobacco Use  . Smoking status: Never Smoker  . Smokeless tobacco: Never Used  Substance and Sexual Activity  . Alcohol use: Yes    Comment: social  . Drug use: Never  . Sexual activity: Not on file  Other Topics Concern  . Not on file  Social History Narrative  . Not on file   Social Determinants of Health   Financial Resource Strain: Not on file  Food Insecurity: Not on file  Transportation Needs: Not on file  Physical Activity: Not on file  Stress: Not on file  Social Connections: Not on file     FAMILY HISTORY:  We obtained  a detailed, 4-generation family history.  Significant diagnoses are listed below: Family History  Problem Relation Age of Onset  . Thyroid cancer Mother   . Prostate cancer Father   . Congestive Heart Failure Father   . Skin cancer Father   . Thyroid cancer Maternal Aunt 60  . Throat cancer Paternal Grandfather   . Cancer Cousin        unknown cancer - mat first cousin    The patient has one daughter who is cancer free. She has three sisters who are cancer free.  Her mother is living and her father is deceased.  The patient's father had prostate and skin cancer.  He had three  brothers and a sister who are all cancer free.  His father died of possible throat cancer.  The patient's mother had thyroid cancer at an unknown age.  She had 10 siblings.  One sister had thyroid cancer around age 22.  There is one maternal cousin with an unknown cancer.  Nicole Potter is unaware of previous family history of genetic testing for hereditary cancer risks. Patient's maternal ancestors are of Caucasian descent, and paternal ancestors are of [place] descent. There is no reported Ashkenazi Jewish ancestry. There is no known consanguinity.   GENETIC COUNSELING ASSESSMENT: Nicole Potter is a 60 y.o. female with a personal and family history of cancer which is somewhat suggestive of a hereditary cancer syndrome and predisposition to cancer given the combination of cancer in herself and her father. We, therefore, discussed and recommended the following at today's visit.   DISCUSSION: We discussed that 5 - 10% of breast cancer is hereditary, with most cases associated with BRCA mutations.   However, due to her lobular cancer type, the chance of a BRCA mutation is lower.  There are other genes that can be associated with hereditary breast cancer syndromes.  These include ATM, CHEK2 and PALB2.  We discussed that testing is beneficial for several reasons including knowing how to follow individuals after completing their treatment, identifying whether potential treatment options such as PARP inhibitors would be beneficial, and understand if other family members could be at risk for cancer and allow them to undergo genetic testing.   We reviewed the characteristics, features and inheritance patterns of hereditary cancer syndromes. We also discussed genetic testing, including the appropriate family members to test, the process of testing, insurance coverage and turn-around-time for results. We discussed the implications of a negative, positive, carrier and/or variant of uncertain significant result. We recommended  Nicole Potter pursue genetic testing for the CancerNext-Expanded gene panel.  We will first order the BRCAPlus testing for urgent results.  The CancerNext-Expanded gene panel offered by [facility] and includes sequencing and rearrangement analysis for the following 77 genes: AIP, ALK, APC*, ATM*, AXIN2, BAP1, BARD1, BLM, BMPR1A, BRCA1*, BRCA2*, BRIP1*, CDC73, CDH1*, CDK4, CDKN1B, CDKN2A, CHEK2*, CTNNA1, DICER1, FANCC, FH, FLCN, GALNT12, KIF1B, LZTR1, MAX, MEN1, MET, MLH1*, MSH2*, MSH3, MSH6*, MUTYH*, NBN, NF1*, NF2, NTHL1, PALB2*, PHOX2B, PMS2*, POT1, PRKAR1A, PTCH1, PTEN*, RAD51C*, RAD51D*, RB1, RECQL, RET, SDHA, SDHAF2, SDHB, SDHC, SDHD, SMAD4, SMARCA4, SMARCB1, SMARCE1, STK11, SUFU, TMEM127, TP53*, TSC1, TSC2, VHL and XRCC2 (sequencing and deletion/duplication); EGFR, EGLN1, HOXB13, KIT, MITF, PDGFRA, POLD1, and POLE (sequencing only); EPCAM and GREM1 (deletion/duplication only). DNA and RNA analyses performed for * genes.   Based on Nicole Potter's personal and family history of cancer, she meets medical criteria for genetic testing. Despite that she meets criteria, she may still have an out of pocket cost. We  discussed that if her out of pocket cost for testing is over $100, the laboratory will call and confirm whether she wants to proceed with testing.  If the out of pocket cost of testing is less than $100 she will be billed by the genetic testing laboratory.   PLAN: After considering the risks, benefits, and limitations, Nicole Potter provided informed consent to pursue genetic testing and the blood sample was sent to [facility] for analysis of the CancerNext-Expanded. Results should be available within approximately 2-3 weeks' time, at which point they will be disclosed by telephone to Nicole Potter, as will any additional recommendations warranted by these results. Nicole Potter will receive a summary of her genetic counseling visit and a copy of her results once available. This information will also be  available in Epic.   Lastly, we encouraged Nicole Potter to remain in contact with cancer genetics annually so that we can continuously update the family history and inform her of any changes in cancer genetics and testing that may be of benefit for this family.   Nicole Potter questions were answered to her satisfaction today. Our contact information was provided should additional questions or concerns arise. Thank you for the referral and allowing [place] to share in the care of your patient.   Nicole Potter, [place], [facility] Licensed, Insurance risk surveyor [email or web address] phone: [phone]  The patient was seen for a total of 40 minutes in face-to-face genetic counseling.  This patient was discussed with Drs. Magrinat, Lindi Adie and/or Burr Medico who agrees with the above.    _______________________________________________________________________ For Office Staff:  Number of people involved in session: 1 Was an Intern/ student involved with case: yes Nicole Potter

## 2020-04-20 ENCOUNTER — Other Ambulatory Visit: Payer: Self-pay

## 2020-04-20 ENCOUNTER — Inpatient Hospital Stay: Payer: BC Managed Care – PPO | Attending: Hematology and Oncology

## 2020-04-20 ENCOUNTER — Other Ambulatory Visit (HOSPITAL_COMMUNITY)
Admission: RE | Admit: 2020-04-20 | Discharge: 2020-04-20 | Disposition: A | Discharge status: Home / Self Care | Payer: BC Managed Care – PPO | Source: Ambulatory Visit | Attending: General Surgery | Admitting: General Surgery

## 2020-04-20 DIAGNOSIS — Z01812 Encounter for preprocedural laboratory examination: Secondary | ICD-10-CM | POA: Diagnosis not present

## 2020-04-20 DIAGNOSIS — Z20822 Contact with and (suspected) exposure to covid-19: Secondary | ICD-10-CM | POA: Insufficient documentation

## 2020-04-20 DIAGNOSIS — C50412 Malignant neoplasm of upper-outer quadrant of left female breast: Secondary | ICD-10-CM | POA: Insufficient documentation

## 2020-04-20 DIAGNOSIS — Z8042 Family history of malignant neoplasm of prostate: Secondary | ICD-10-CM | POA: Diagnosis not present

## 2020-04-20 DIAGNOSIS — Z170 Estrogen receptor positive status [ER+]: Secondary | ICD-10-CM | POA: Insufficient documentation

## 2020-04-20 LAB — GENETIC SCREENING ORDER

## 2020-04-20 LAB — SARS CORONAVIRUS 2 (TAT 6-24 HRS): SARS Coronavirus 2: NEGATIVE

## 2020-04-20 NOTE — Progress Notes (Signed)

## 2020-04-21 ENCOUNTER — Ambulatory Visit
Admission: RE | Admit: 2020-04-21 | Discharge: 2020-04-21 | Disposition: A | Discharge status: Home / Self Care | Payer: BC Managed Care – PPO | Source: Ambulatory Visit | Attending: General Surgery | Admitting: General Surgery

## 2020-04-21 DIAGNOSIS — D0511 Intraductal carcinoma in situ of right breast: Secondary | ICD-10-CM | POA: Diagnosis not present

## 2020-04-21 DIAGNOSIS — Z170 Estrogen receptor positive status [ER+]: Secondary | ICD-10-CM

## 2020-04-21 DIAGNOSIS — N6092 Unspecified benign mammary dysplasia of left breast: Secondary | ICD-10-CM

## 2020-04-24 ENCOUNTER — Encounter (HOSPITAL_BASED_OUTPATIENT_CLINIC_OR_DEPARTMENT_OTHER)
Admission: RE | Disposition: A | Discharge status: Home / Self Care | Payer: Self-pay | Source: Home / Self Care | Attending: General Surgery

## 2020-04-24 ENCOUNTER — Ambulatory Visit (HOSPITAL_BASED_OUTPATIENT_CLINIC_OR_DEPARTMENT_OTHER)
Admission: RE | Admit: 2020-04-24 | Discharge: 2020-04-24 | Disposition: A | Discharge status: Home / Self Care | Payer: BC Managed Care – PPO | Attending: General Surgery | Admitting: General Surgery

## 2020-04-24 ENCOUNTER — Encounter (HOSPITAL_BASED_OUTPATIENT_CLINIC_OR_DEPARTMENT_OTHER): Payer: Self-pay | Admitting: General Surgery

## 2020-04-24 ENCOUNTER — Ambulatory Visit (HOSPITAL_BASED_OUTPATIENT_CLINIC_OR_DEPARTMENT_OTHER): Payer: BC Managed Care – PPO | Admitting: Anesthesiology

## 2020-04-24 ENCOUNTER — Encounter (HOSPITAL_COMMUNITY)
Admission: RE | Admit: 2020-04-24 | Discharge: 2020-04-24 | Disposition: A | Discharge status: Home / Self Care | Payer: BC Managed Care – PPO | Source: Ambulatory Visit | Attending: General Surgery | Admitting: General Surgery

## 2020-04-24 ENCOUNTER — Ambulatory Visit
Admission: RE | Admit: 2020-04-24 | Discharge: 2020-04-24 | Disposition: A | Discharge status: Home / Self Care | Payer: BC Managed Care – PPO | Source: Ambulatory Visit | Attending: General Surgery | Admitting: General Surgery

## 2020-04-24 ENCOUNTER — Other Ambulatory Visit: Payer: Self-pay

## 2020-04-24 DIAGNOSIS — C50912 Malignant neoplasm of unspecified site of left female breast: Secondary | ICD-10-CM | POA: Diagnosis not present

## 2020-04-24 DIAGNOSIS — Z79899 Other long term (current) drug therapy: Secondary | ICD-10-CM | POA: Diagnosis not present

## 2020-04-24 DIAGNOSIS — C50412 Malignant neoplasm of upper-outer quadrant of left female breast: Secondary | ICD-10-CM | POA: Diagnosis not present

## 2020-04-24 DIAGNOSIS — G8918 Other acute postprocedural pain: Secondary | ICD-10-CM | POA: Diagnosis not present

## 2020-04-24 DIAGNOSIS — Z170 Estrogen receptor positive status [ER+]: Secondary | ICD-10-CM | POA: Insufficient documentation

## 2020-04-24 DIAGNOSIS — D0502 Lobular carcinoma in situ of left breast: Secondary | ICD-10-CM | POA: Diagnosis not present

## 2020-04-24 DIAGNOSIS — E039 Hypothyroidism, unspecified: Secondary | ICD-10-CM | POA: Diagnosis not present

## 2020-04-24 DIAGNOSIS — F419 Anxiety disorder, unspecified: Secondary | ICD-10-CM | POA: Diagnosis not present

## 2020-04-24 DIAGNOSIS — D0512 Intraductal carcinoma in situ of left breast: Secondary | ICD-10-CM | POA: Diagnosis not present

## 2020-04-24 DIAGNOSIS — N6092 Unspecified benign mammary dysplasia of left breast: Secondary | ICD-10-CM | POA: Diagnosis not present

## 2020-04-24 DIAGNOSIS — N6012 Diffuse cystic mastopathy of left breast: Secondary | ICD-10-CM | POA: Diagnosis not present

## 2020-04-24 DIAGNOSIS — D0511 Intraductal carcinoma in situ of right breast: Secondary | ICD-10-CM | POA: Diagnosis not present

## 2020-04-24 DIAGNOSIS — N6022 Fibroadenosis of left breast: Secondary | ICD-10-CM | POA: Diagnosis not present

## 2020-04-24 HISTORY — DX: Family history of other specified conditions: Z84.89

## 2020-04-24 HISTORY — DX: Anxiety disorder, unspecified: F41.9

## 2020-04-24 HISTORY — PX: BREAST LUMPECTOMY WITH RADIOACTIVE SEED AND SENTINEL LYMPH NODE BIOPSY: SHX6550

## 2020-04-24 HISTORY — PX: BREAST LUMPECTOMY: SHX2

## 2020-04-24 HISTORY — DX: Hypothyroidism, unspecified: E03.9

## 2020-04-24 SURGERY — BREAST LUMPECTOMY WITH RADIOACTIVE SEED AND SENTINEL LYMPH NODE BIOPSY
Anesthesia: General | Site: Breast | Laterality: Left

## 2020-04-24 MED ORDER — FENTANYL CITRATE (PF) 100 MCG/2ML IJ SOLN
INTRAMUSCULAR | Status: DC | PRN
  Administered 2020-04-24 (×2): 25 ug via INTRAVENOUS
  Administered 2020-04-24: 50 ug via INTRAVENOUS
  Administered 2020-04-24 (×2): 25 ug via INTRAVENOUS
  Administered 2020-04-24: 50 ug via INTRAVENOUS

## 2020-04-24 MED ORDER — ACETAMINOPHEN 500 MG PO TABS
1000.0000 mg | ORAL_TABLET | ORAL | Status: AC
  Administered 2020-04-24: 1000 mg via ORAL

## 2020-04-24 MED ORDER — SODIUM CHLORIDE (PF) 0.9 % IJ SOLN
INTRAMUSCULAR | Status: AC
  Filled 2020-04-24: qty 10

## 2020-04-24 MED ORDER — FENTANYL CITRATE (PF) 100 MCG/2ML IJ SOLN
INTRAMUSCULAR | Status: AC
  Filled 2020-04-24: qty 2

## 2020-04-24 MED ORDER — TECHNETIUM TC 99M TILMANOCEPT KIT
1.0000 | PACK | Freq: Once | INTRAVENOUS | Status: AC | PRN
  Administered 2020-04-24: 1 via INTRADERMAL

## 2020-04-24 MED ORDER — CHLORHEXIDINE GLUCONATE CLOTH 2 % EX PADS: 6.0000 | MEDICATED_PAD | Freq: Once | CUTANEOUS | Status: DC

## 2020-04-24 MED ORDER — ACETAMINOPHEN 500 MG PO TABS
ORAL_TABLET | ORAL | Status: AC
  Filled 2020-04-24: qty 2

## 2020-04-24 MED ORDER — EPHEDRINE 5 MG/ML INJ
INTRAVENOUS | Status: AC
  Filled 2020-04-24: qty 20

## 2020-04-24 MED ORDER — LACTATED RINGERS IV SOLN: INTRAVENOUS | Status: DC

## 2020-04-24 MED ORDER — ROPIVACAINE HCL 5 MG/ML IJ SOLN
INTRAMUSCULAR | Status: DC | PRN
  Administered 2020-04-24: 30 mL via PERINEURAL

## 2020-04-24 MED ORDER — DEXAMETHASONE SODIUM PHOSPHATE 10 MG/ML IJ SOLN
INTRAMUSCULAR | Status: AC
  Filled 2020-04-24: qty 1

## 2020-04-24 MED ORDER — HYDROCODONE-ACETAMINOPHEN 5-325 MG PO TABS: 1.0000 | ORAL_TABLET | Freq: Four times a day (QID) | ORAL | 0 refills | Status: DC | PRN

## 2020-04-24 MED ORDER — EPHEDRINE SULFATE-NACL 50-0.9 MG/10ML-% IV SOSY
PREFILLED_SYRINGE | INTRAVENOUS | Status: DC | PRN
  Administered 2020-04-24: 10 mg via INTRAVENOUS

## 2020-04-24 MED ORDER — LIDOCAINE 2% (20 MG/ML) 5 ML SYRINGE
INTRAMUSCULAR | Status: DC | PRN
  Administered 2020-04-24: 60 mg via INTRAVENOUS

## 2020-04-24 MED ORDER — OXYCODONE HCL 5 MG PO TABS: 5.0000 mg | ORAL_TABLET | Freq: Once | ORAL | Status: DC | PRN

## 2020-04-24 MED ORDER — PHENYLEPHRINE 40 MCG/ML (10ML) SYRINGE FOR IV PUSH (FOR BLOOD PRESSURE SUPPORT)
PREFILLED_SYRINGE | INTRAVENOUS | Status: AC
  Filled 2020-04-24: qty 10

## 2020-04-24 MED ORDER — ONDANSETRON HCL 4 MG/2ML IJ SOLN: 4.0000 mg | Freq: Once | INTRAMUSCULAR | Status: DC | PRN

## 2020-04-24 MED ORDER — CELECOXIB 200 MG PO CAPS
200.0000 mg | ORAL_CAPSULE | ORAL | Status: AC
  Administered 2020-04-24: 200 mg via ORAL

## 2020-04-24 MED ORDER — FENTANYL CITRATE (PF) 100 MCG/2ML IJ SOLN: 25.0000 ug | INTRAMUSCULAR | Status: DC | PRN

## 2020-04-24 MED ORDER — AMISULPRIDE (ANTIEMETIC) 5 MG/2ML IV SOLN: 10.0000 mg | Freq: Once | INTRAVENOUS | Status: DC | PRN

## 2020-04-24 MED ORDER — PROPOFOL 10 MG/ML IV BOLUS
INTRAVENOUS | Status: DC | PRN
  Administered 2020-04-24: 200 mg via INTRAVENOUS

## 2020-04-24 MED ORDER — PROPOFOL 500 MG/50ML IV EMUL
INTRAVENOUS | Status: AC
  Filled 2020-04-24: qty 50

## 2020-04-24 MED ORDER — ONDANSETRON HCL 4 MG/2ML IJ SOLN
INTRAMUSCULAR | Status: AC
  Filled 2020-04-24: qty 2

## 2020-04-24 MED ORDER — CEFAZOLIN SODIUM-DEXTROSE 2-4 GM/100ML-% IV SOLN
2.0000 g | INTRAVENOUS | Status: AC
  Administered 2020-04-24: 2 g via INTRAVENOUS

## 2020-04-24 MED ORDER — OXYCODONE HCL 5 MG/5ML PO SOLN: 5.0000 mg | Freq: Once | ORAL | Status: DC | PRN

## 2020-04-24 MED ORDER — GABAPENTIN 300 MG PO CAPS
ORAL_CAPSULE | ORAL | Status: AC
  Filled 2020-04-24: qty 1

## 2020-04-24 MED ORDER — METHYLENE BLUE 0.5 % INJ SOLN
INTRAVENOUS | Status: AC
  Filled 2020-04-24: qty 10

## 2020-04-24 MED ORDER — BUPIVACAINE-EPINEPHRINE 0.25% -1:200000 IJ SOLN
INTRAMUSCULAR | Status: DC | PRN
  Administered 2020-04-24: 26 mL

## 2020-04-24 MED ORDER — GABAPENTIN 300 MG PO CAPS
300.0000 mg | ORAL_CAPSULE | ORAL | Status: AC
  Administered 2020-04-24: 300 mg via ORAL

## 2020-04-24 MED ORDER — LIDOCAINE 2% (20 MG/ML) 5 ML SYRINGE
INTRAMUSCULAR | Status: AC
  Filled 2020-04-24: qty 5

## 2020-04-24 MED ORDER — MIDAZOLAM HCL 2 MG/2ML IJ SOLN
INTRAMUSCULAR | Status: AC
  Filled 2020-04-24: qty 2

## 2020-04-24 MED ORDER — FENTANYL CITRATE (PF) 100 MCG/2ML IJ SOLN
100.0000 ug | Freq: Once | INTRAMUSCULAR | Status: AC
  Administered 2020-04-24: 50 ug via INTRAVENOUS

## 2020-04-24 MED ORDER — ONDANSETRON HCL 4 MG/2ML IJ SOLN
INTRAMUSCULAR | Status: DC | PRN
  Administered 2020-04-24: 4 mg via INTRAVENOUS

## 2020-04-24 MED ORDER — DEXAMETHASONE SODIUM PHOSPHATE 10 MG/ML IJ SOLN
INTRAMUSCULAR | Status: DC | PRN
  Administered 2020-04-24: 10 mg via INTRAVENOUS

## 2020-04-24 MED ORDER — MIDAZOLAM HCL 2 MG/2ML IJ SOLN
2.0000 mg | Freq: Once | INTRAMUSCULAR | Status: AC
  Administered 2020-04-24: 2 mg via INTRAVENOUS

## 2020-04-24 MED ORDER — CELECOXIB 200 MG PO CAPS
ORAL_CAPSULE | ORAL | Status: AC
  Filled 2020-04-24: qty 1

## 2020-04-24 MED ORDER — 0.9 % SODIUM CHLORIDE (POUR BTL) OPTIME
TOPICAL | Status: DC | PRN
  Administered 2020-04-24: 1000 mL

## 2020-04-24 MED ORDER — CEFAZOLIN SODIUM-DEXTROSE 2-4 GM/100ML-% IV SOLN
INTRAVENOUS | Status: AC
  Filled 2020-04-24: qty 100

## 2020-04-24 SURGICAL SUPPLY — 46 items
ADH SKN CLS APL DERMABOND .7 (GAUZE/BANDAGES/DRESSINGS) ×1
APL PRP STRL LF DISP 70% ISPRP (MISCELLANEOUS) ×1
APPLIER CLIP 9.375 MED OPEN (MISCELLANEOUS) ×2
APR CLP MED 9.3 20 MLT OPN (MISCELLANEOUS) ×1
BINDER BREAST LRG (GAUZE/BANDAGES/DRESSINGS) ×1 IMPLANT
BLADE SURG 15 STRL LF DISP TIS (BLADE) ×1 IMPLANT
BLADE SURG 15 STRL SS (BLADE) ×2
CANISTER SUC SOCK COL 7IN (MISCELLANEOUS) IMPLANT
CANISTER SUCT 1200ML W/VALVE (MISCELLANEOUS) IMPLANT
CHLORAPREP W/TINT 26 (MISCELLANEOUS) ×2 IMPLANT
CLIP APPLIE 9.375 MED OPEN (MISCELLANEOUS) ×1 IMPLANT
COVER BACK TABLE 60X90IN (DRAPES) ×2 IMPLANT
COVER MAYO STAND STRL (DRAPES) ×2 IMPLANT
COVER PROBE W GEL 5X96 (DRAPES) ×2 IMPLANT
COVER WAND RF STERILE (DRAPES) IMPLANT
DECANTER SPIKE VIAL GLASS SM (MISCELLANEOUS) IMPLANT
DERMABOND ADVANCED (GAUZE/BANDAGES/DRESSINGS) ×1
DERMABOND ADVANCED .7 DNX12 (GAUZE/BANDAGES/DRESSINGS) ×1 IMPLANT
DRAPE LAPAROSCOPIC ABDOMINAL (DRAPES) ×2 IMPLANT
DRAPE UTILITY XL STRL (DRAPES) ×2 IMPLANT
ELECT COATED BLADE 2.86 ST (ELECTRODE) ×2 IMPLANT
ELECT REM PT RETURN 9FT ADLT (ELECTROSURGICAL) ×2
ELECTRODE REM PT RTRN 9FT ADLT (ELECTROSURGICAL) ×1 IMPLANT
GLOVE SURG ENC MOIS LTX SZ7.5 (GLOVE) ×2 IMPLANT
GOWN STRL REUS W/ TWL LRG LVL3 (GOWN DISPOSABLE) ×2 IMPLANT
GOWN STRL REUS W/TWL LRG LVL3 (GOWN DISPOSABLE) ×4
ILLUMINATOR WAVEGUIDE N/F (MISCELLANEOUS) IMPLANT
KIT MARKER MARGIN INK (KITS) ×2 IMPLANT
LIGHT WAVEGUIDE WIDE FLAT (MISCELLANEOUS) IMPLANT
NDL HYPO 25X1 1.5 SAFETY (NEEDLE) ×1 IMPLANT
NDL SAFETY ECLIPSE 18X1.5 (NEEDLE) IMPLANT
NEEDLE HYPO 18GX1.5 SHARP (NEEDLE)
NEEDLE HYPO 25X1 1.5 SAFETY (NEEDLE) ×2 IMPLANT
NS IRRIG 1000ML POUR BTL (IV SOLUTION) IMPLANT
PACK BASIN DAY SURGERY FS (CUSTOM PROCEDURE TRAY) ×2 IMPLANT
PENCIL SMOKE EVACUATOR (MISCELLANEOUS) ×2 IMPLANT
SLEEVE SCD COMPRESS KNEE MED (MISCELLANEOUS) ×2 IMPLANT
SPONGE LAP 18X18 RF (DISPOSABLE) ×2 IMPLANT
SUT MON AB 4-0 PC3 18 (SUTURE) ×4 IMPLANT
SUT SILK 2 0 SH (SUTURE) IMPLANT
SUT VICRYL 3-0 CR8 SH (SUTURE) ×2 IMPLANT
SYR CONTROL 10ML LL (SYRINGE) ×2 IMPLANT
TOWEL GREEN STERILE FF (TOWEL DISPOSABLE) ×2 IMPLANT
TRAY FAXITRON CT DISP (TRAY / TRAY PROCEDURE) ×2 IMPLANT
TUBE CONNECTING 20X1/4 (TUBING) IMPLANT
YANKAUER SUCT BULB TIP NO VENT (SUCTIONS) IMPLANT

## 2020-04-24 NOTE — Progress Notes (Signed)
Assisted Dr. Christella Hartigan with left, ultrasound guided, pectoralis block. Side rails up, monitors on throughout procedure. See vital signs in flow sheet. Tolerated Procedure well.

## 2020-04-24 NOTE — Transfer of Care (Signed)
Immediate Anesthesia Transfer of Care Note  Patient: Alfred K Martindelcampo  Procedure(s) Performed: LEFT BREAST LUMPECTOMY  WITH RADIOACTIVE SEEDS X 2  AND LEFT AXILLARY SENTINEL LYMPH NODE BIOPSY X 3 (Left Breast)  Patient Location: PACU  Anesthesia Type:GA combined with regional for post-op pain  Level of Consciousness: drowsy and patient cooperative  Airway & Oxygen Therapy: Patient Spontanous Breathing and Patient connected to face mask oxygen  Post-op Assessment: Report given to RN and Post -op Vital signs reviewed and stable  Post vital signs: Reviewed and stable  Last Vitals:  Vitals Value Taken Time  BP 105/56 04/24/20 0953  Temp 36.6 C 04/24/20 0953  Pulse 78 04/24/20 0957  Resp 13 04/24/20 0957  SpO2 97 % 04/24/20 0957  Vitals shown include unvalidated device data.  Last Pain:  Vitals:   04/24/20 0659  TempSrc: Oral  PainSc: 0-No pain      Patients Stated Pain Goal: 7 (76/81/15 7262)  Complications: No complications documented.

## 2020-04-24 NOTE — Progress Notes (Signed)
Nuc med inj performed by nuc med staff. Pt tol well with no additional sedation required. VSS. Emotional support provided.

## 2020-04-24 NOTE — Anesthesia Procedure Notes (Signed)
Procedure Name: LMA Insertion Date/Time: 04/24/2020 8:32 AM Performed by: Genelle Bal, CRNA Pre-anesthesia Checklist: Patient identified, Emergency Drugs available, Suction available and Patient being monitored Patient Re-evaluated:Patient Re-evaluated prior to induction Oxygen Delivery Method: Circle system utilized Preoxygenation: Pre-oxygenation with 100% oxygen Induction Type: IV induction Ventilation: Mask ventilation without difficulty LMA: LMA inserted LMA Size: 4.0 Number of attempts: 1 Airway Equipment and Method: Bite block Placement Confirmation: positive ETCO2 Tube secured with: Tape Dental Injury: Teeth and Oropharynx as per pre-operative assessment

## 2020-04-24 NOTE — Anesthesia Preprocedure Evaluation (Signed)
Anesthesia Evaluation  Patient identified by MRN, date of birth, ID band Patient awake    Reviewed: Allergy & Precautions, NPO status , Patient's Chart, lab work & pertinent test results  History of Anesthesia Complications Negative for: history of anesthetic complications  Airway Mallampati: II  TM Distance: >3 FB Neck ROM: Full    Dental  (+) Teeth Intact   Pulmonary neg pulmonary ROS,    Pulmonary exam normal        Cardiovascular negative cardio ROS Normal cardiovascular exam     Neuro/Psych Anxiety negative neurological ROS     GI/Hepatic negative GI ROS, Neg liver ROS,   Endo/Other  Hypothyroidism   Renal/GU negative Renal ROS  negative genitourinary   Musculoskeletal negative musculoskeletal ROS (+)   Abdominal   Peds  Hematology negative hematology ROS (+)   Anesthesia Other Findings  Breast cancer  Reproductive/Obstetrics                             Anesthesia Physical Anesthesia Plan  ASA: II  Anesthesia Plan: General   Post-op Pain Management: GA combined w/ Regional for post-op pain   Induction: Intravenous  PONV Risk Score and Plan: 3 and Ondansetron, Dexamethasone, Midazolam and Treatment may vary due to age or medical condition  Airway Management Planned: LMA  Additional Equipment: None  Intra-op Plan:   Post-operative Plan: Extubation in OR  Informed Consent: I have reviewed the patients History and Physical, chart, labs and discussed the procedure including the risks, benefits and alternatives for the proposed anesthesia with the patient or authorized representative who has indicated his/her understanding and acceptance.     Dental advisory given  Plan Discussed with:   Anesthesia Plan Comments:         Anesthesia Quick Evaluation

## 2020-04-24 NOTE — Anesthesia Postprocedure Evaluation (Signed)
Anesthesia Post Note  Patient: Nicole Potter  Procedure(s) Performed: LEFT BREAST LUMPECTOMY  WITH RADIOACTIVE SEEDS X 2  AND LEFT AXILLARY SENTINEL LYMPH NODE BIOPSY X 3 (Left Breast)     Patient location during evaluation: PACU Anesthesia Type: General Level of consciousness: awake and alert Pain management: pain level controlled Vital Signs Assessment: post-procedure vital signs reviewed and stable Respiratory status: spontaneous breathing, nonlabored ventilation and respiratory function stable Cardiovascular status: blood pressure returned to baseline and stable Postop Assessment: no apparent nausea or vomiting Anesthetic complications: no   No complications documented.  Last Vitals:  Vitals:   04/24/20 1015 04/24/20 1030  BP: 113/71 126/69  Pulse: 93 94  Resp: (!) 21 20  Temp:  36.5 C  SpO2: 97% 97%    Last Pain:  Vitals:   04/24/20 1030  TempSrc:   PainSc: 0-No pain                 Lidia Collum

## 2020-04-24 NOTE — Anesthesia Procedure Notes (Signed)
Anesthesia Regional Block: Pectoralis block   Pre-Anesthetic Checklist: ,, timeout performed, Correct Patient, Correct Site, Correct Laterality, Correct Procedure, Correct Position, site marked, Risks and benefits discussed,  Surgical consent,  Pre-op evaluation,  At surgeon's request and post-op pain management  Laterality: Left  Prep: chloraprep       Needles:  Injection technique: Single-shot  Needle Type: Echogenic Stimulator Needle     Needle Length: 10cm  Needle Gauge: 20     Additional Needles:   Procedures:,,,, ultrasound used (permanent image in chart),,,,  Narrative:  Start time: 04/24/2020 7:17 AM End time: 04/24/2020 7:22 AM Injection made incrementally with aspirations every 5 mL.  Performed by: Personally  Anesthesiologist: Lidia Collum, MD  Additional Notes: Standard monitors applied. Skin prepped. Good needle visualization with ultrasound. Injection made in 5cc increments with no resistance to injection. Patient tolerated the procedure well.

## 2020-04-24 NOTE — Interval H&P Note (Signed)
History and Physical Interval Note:  04/24/2020 8:11 AM  Nicole Potter  has presented today for surgery, with the diagnosis of LEFT BREAST CANCER AND ATYPICAL LOBULAR HYPERPLASIA.  The various methods of treatment have been discussed with the patient and family. After consideration of risks, benefits and other options for treatment, the patient has consented to  Procedure(s) with comments: LEFT BREAST LUMPECTOMY X 2  WITH RADIOACTIVE SEED AND SENTINEL LYMPH NODE BIOPSY (Left) - PECTORAL BLOCK as a surgical intervention.  The patient's history has been reviewed, patient examined, no change in status, stable for surgery.  I have reviewed the patient's chart and labs.  Questions were answered to the patient's satisfaction.     Nicole Potter

## 2020-04-24 NOTE — Op Note (Signed)
04/24/2020  9:47 AM  PATIENT:  Nicole Potter  60 y.o. female  PRE-OPERATIVE DIAGNOSIS:  LEFT BREAST CANCER AND ATYPICAL LOBULAR HYPERPLASIA  POST-OPERATIVE DIAGNOSIS:  LEFT BREAST CANCER AND ATYPICAL LOBULAR HYPERPLASIA  PROCEDURE:  Procedure(s): LEFT BREAST LUMPECTOMY  WITH RADIOACTIVE SEED LOCALIZATION X 2  AND LEFT DEEP AXILLARY SENTINEL LYMPH NODE BIOPSY X 3 (Left)  SURGEON:  Surgeon(s) and Role:    * Jovita Kussmaul, MD - Primary  PHYSICIAN ASSISTANT:   ASSISTANTS: none   ANESTHESIA:   local and general  EBL:  10 mL   BLOOD ADMINISTERED:none  DRAINS: none   LOCAL MEDICATIONS USED:  MARCAINE     SPECIMEN:  Source of Specimen:  Left breast tissue and sentinel nodes x 3  DISPOSITION OF SPECIMEN:  PATHOLOGY  COUNTS:  YES  TOURNIQUET:  * No tourniquets in log *  DICTATION: .Dragon Dictation   After informed consent was obtained the patient was brought to the operating room and placed in the supine position on the operating table.  After adequate induction of general anesthesia the patient's left chest, breast, and axillary area were prepped with ChloraPrep, allowed to dry, and draped in usual sterile manner.  An appropriate timeout was performed.  Previously an I-125 seed was placed in the upper outer quadrant of the left breast to mark an area of invasive breast cancer.  Also earlier in the day the patient underwent injection of 1 mCi of technetium sulfur colloid in the subareolar position on the left.  The neoprobe was initially set to technetium.  There was a good signal in the left axilla.  This area was infiltrated with quarter percent Marcaine.  A small transversely oriented incision was made with a 15 blade knife overlying the area of radioactivity.  The incision was carried through the skin and subcutaneous tissue sharply with the electrocautery until the deep left axillary space was entered.  Blunt hemostat dissection was then directed by the neoprobe.  I was able to  identify 3 hot lymph nodes.  These were each excised sharply with the electrocautery and the surrounding small vessels and lymphatics were controlled with clips.  Ex vivo counts on these nodes ranged from 100 to 1500.  No other hot or palpable nodes were identified in the left axilla.  Hemostasis was achieved using the Bovie electrocautery.  The deep layer of the incision was then closed with interrupted 3-0 Vicryl stitches.  The skin was then closed with a running 4-0 Monocryl subcuticular stitch.  Attention was then turned to the left breast.  The neoprobe was set to I-125 in the area of radioactivity was readily identified.  There were actually 2 seeds in the upper outer quadrant very close to each other.  One was marking an area of invasive breast cancer and the other an area of atypical lobular hyperplasia.  Because of the location of the seeds I elected to make an elliptical incision in the skin overlying the area of radioactivity with a 15 blade knife.  The incision was carried through the skin and subcutaneous tissue sharply with the electrocautery.  Dissection was then carried around the 2 radioactive seeds while checking the area of radioactivity frequently.  This dissection was carried all the way to the chest wall.  Once the specimen was removed it was oriented with the appropriate paint colors.  A specimen radiograph was obtained that showed both seeds and clips to be near the center of the specimen.  The specimen was then  sent to pathology for further evaluation.  Hemostasis was achieved using the Bovie electrocautery.  The wound was irrigated with saline and infiltrated with more quarter percent Marcaine.  The cavity was marked with clips.  The deep layer of the wound was then closed with layers of interrupted 3-0 Vicryl stitches.  The skin was then closed with a running 4-0 Monocryl subcuticular stitch.  Dermabond dressings were applied.  The patient tolerated the procedure well.  At the end of the  case all needle sponge and instrument counts were correct.  The patient was then awakened and taken recovery in stable condition.  PLAN OF CARE: Discharge to home after PACU  PATIENT DISPOSITION:  PACU - hemodynamically stable.   Delay start of Pharmacological VTE agent (>24hrs) due to surgical blood loss or risk of bleeding: not applicable

## 2020-04-24 NOTE — Discharge Instructions (Signed)
Next dose of Tylenol can be given after 1:00PM. Next dose of NSAID (Ibuprofen, Aleve, Motrin) can be given after 1:00PM.     Post Anesthesia Home Care Instructions  Activity: Get plenty of rest for the remainder of the day. A responsible individual must stay with you for 24 hours following the procedure.  For the next 24 hours, DO NOT: -Drive a car -Paediatric nurse -Drink alcoholic beverages -Take any medication unless instructed by your physician -Make any legal decisions or sign important papers.  Meals: Start with liquid foods such as gelatin or soup. Progress to regular foods as tolerated. Avoid greasy, spicy, heavy foods. If nausea and/or vomiting occur, drink only clear liquids until the nausea and/or vomiting subsides. Call your physician if vomiting continues.  Special Instructions/Symptoms: Your throat may feel dry or sore from the anesthesia or the breathing tube placed in your throat during surgery. If this causes discomfort, gargle with warm salt water. The discomfort should disappear within 24 hours.  If you had a scopolamine patch placed behind your ear for the management of post- operative nausea and/or vomiting:  1. The medication in the patch is effective for 72 hours, after which it should be removed.  Wrap patch in a tissue and discard in the trash. Wash hands thoroughly with soap and water. 2. You may remove the patch earlier than 72 hours if you experience unpleasant side effects which may include dry mouth, dizziness or visual disturbances. 3. Avoid touching the patch. Wash your hands with soap and water after contact with the patch.

## 2020-04-24 NOTE — H&P (Signed)
[place]  Location: [facility] Patient #: [id number] DOB: 17-Jul-1960 Single / Language: Cleophus Molt / Race: White Female   History of Present Illness  The patient is a 60 year old female who presents with breast cancer. We are asked to see the patient in consultation by Dr. Ronita Hipps to evaluate her for a new left breast cancer. The patient is a 60 year old white female who recently went for a routine screening mammogram. At that time she was found to have 2 6 mm areas of calcification adjacent to each other in the upper outer quadrant of the left breast measuring a total of 2 cm. The lymph nodes looked normal. 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 She denies any family history of breast cancer or other cancers. She is otherwise in good health and does not smoke.   Allergies  No Known Drug Allergies   Medication History Wegovy (0.25MG/0.5ML Soln Auto-inj, Subcutaneous) Active. Armour Thyroid (90MG Tablet, Oral) Active. Phentermine HCl (37.5MG Capsule, Oral) Active. Medications Reconciled  Vitals  Weight: 222 lb Height: 69.5in Body Surface Area: 2.17 m Body Mass Index: 32.31 kg/m  Pulse: 130 (Regular)  BP: 130/80(Sitting, Left Arm, Standard)       Physical Exam  General Mental Status-Alert. General Appearance-Consistent with stated age. Hydration-Well hydrated. Voice-Normal.  Head and Neck Head-normocephalic, atraumatic with no lesions or palpable masses. Trachea-midline. Thyroid Gland Characteristics - normal size and consistency.  Eye Eyeball - Bilateral-Extraocular movements intact. Sclera/Conjunctiva - Bilateral-No scleral icterus.  Chest and Lung Exam Chest and lung exam reveals -quiet, even and easy respiratory effort with no use of accessory muscles and on auscultation,  normal breath sounds, no adventitious sounds and normal vocal resonance. Inspection Chest Wall - Normal. Back - normal.  Breast Note: There is no palpable mass in either breast. There is no palpable axillary, supraclavicular, or cervical lymphadenopathy.   Cardiovascular Cardiovascular examination reveals -normal heart sounds, regular rate and rhythm with no murmurs and normal pedal pulses bilaterally.  Abdomen Inspection Inspection of the abdomen reveals - No Hernias. Skin - Scar - no surgical scars. Palpation/Percussion Palpation and Percussion of the abdomen reveal - Soft, Non Tender, No Rebound tenderness, No Rigidity (guarding) and No hepatosplenomegaly. Auscultation Auscultation of the abdomen reveals - Bowel sounds normal.  Neurologic Neurologic evaluation reveals -alert and oriented x 3 with no impairment of recent or remote memory. Mental Status-Normal.  Musculoskeletal Normal Exam - Left-Upper Extremity Strength Normal and Lower Extremity Strength Normal. Normal Exam - Right-Upper Extremity Strength Normal and Lower Extremity Strength Normal.  Lymphatic Head & Neck  General Head & Neck Lymphatics: Bilateral - Description - Normal. Axillary  General Axillary Region: Bilateral - Description - Normal. Tenderness - Non Tender. Femoral & Inguinal  Generalized Femoral & Inguinal Lymphatics: Bilateral - Description - Normal. Tenderness - Non Tender.    Assessment & Plan  MALIGNANT NEOPLASM OF UPPER-OUTER QUADRANT OF LEFT BREAST IN FEMALE, ESTROGEN RECEPTOR POSITIVE (C50.412) Impression: The patient appears to have 2 small 6 mm areas of invasive lobular breast cancer and atypical lobular hyperplasia in the upper outer quadrant of the left breast. I have discussed with her the different options for treatment and at this point she is favoring breast conservation which I believe is a very reasonable way of treating the cancer. She is also a good candidate for  sentinel  node biopsy. I have discussed with her in detail the risks and benefits of the operation as well as some of the technical aspects including the use of a radioactive seed for localization and she understands and wishes to proceed. I will go ahead and refer her to medical and radiation oncology to discuss adjuvant therapy. Since the cancer is lobular nature we will also try to get an MRI study of both breasts. She is also interested in genetic testing. We will call her with the results of the MRI and continued to proceed with surgical planning. I will plan for a left breast radioactive seed localized lumpectomy 2 and sentinel node biopsy. This patient encounter took 60 minutes today to perform the following: take history, perform exam, review outside records, interpret imaging, counsel the patient on their diagnosis and document encounter, findings & plan in the EHR Current Plans MRI, breast, w/o contrast material; bilateral(77047)(ACR 0 )(DSN [id number])(G-Code G1004(MG)) (Clinical Scenarios: No content available for this procedure) Referred to Oncology, for evaluation and follow up (Oncology). Routine. Referred to Genetic Counseling, for evaluation and follow up [employer]). Routine.

## 2020-04-25 ENCOUNTER — Encounter (HOSPITAL_BASED_OUTPATIENT_CLINIC_OR_DEPARTMENT_OTHER): Payer: Self-pay | Admitting: General Surgery

## 2020-04-27 ENCOUNTER — Encounter: Payer: Self-pay | Admitting: *Deleted

## 2020-04-27 DIAGNOSIS — Z170 Estrogen receptor positive status [ER+]: Secondary | ICD-10-CM

## 2020-04-27 LAB — SURGICAL PATHOLOGY

## 2020-04-30 NOTE — Assessment & Plan Note (Signed)
03/21/2020:Screening mammogram detected left breast calcifications. Diagnostic mammogram on 03/02/20 showed two 0.6cm groups of calcifications in the left breast, spanning 2.0cm in total. Biopsy on 03/21/20 showed invasive mammary carcinoma with lobular and ductal features, HER-2 negative (0), ER+ 40% weak, PR+ 40% moderate, Ki67 10%.  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 (Size 2 mm on biopsy)  Pathology counseling: I discussed the final pathology report of the patient provided  a copy of this report. I discussed the margins as well as lymph node surgeries. We also discussed the final staging along with previously performed ER/PR and HER-2/neu testing.  Plan 1. Adj XRT 2. Followed by Adj Anti-estrogen therapy  RTC after XRT is complete

## 2020-04-30 NOTE — Progress Notes (Signed)
Patient Care Team: Lujean Amel, MD as PCP - General (Family Medicine) Mauro Kaufmann, RN as Oncology Nurse Navigator Rockwell Germany, RN as Oncology Nurse Navigator  DIAGNOSIS:    ICD-10-CM   1. Malignant neoplasm of upper-outer quadrant of left breast in female, estrogen receptor positive ([place])  C50.412    Z17.0     SUMMARY OF ONCOLOGIC HISTORY: Oncology History  Malignant neoplasm of upper-outer quadrant of left breast in female, estrogen receptor positive ([place])  03/21/2020 Initial Diagnosis   Screening mammogram detected left breast calcifications. Diagnostic mammogram on 03/02/20 showed two 0.6cm groups of calcifications in the left breast, spanning 2.0cm in total. Biopsy on 03/21/20 showed invasive mammary carcinoma with lobular and ductal features, HER-2 negative (0), ER+ 40% weak, PR+ 40% moderate, Ki67 10%.   04/24/2020 Surgery   Left lumpectomy Nicole Potter): no residual invasive carcinoma, LCIS, and 3 left axillary lymph nodes negative for carcinoma.     CHIEF COMPLIANT: Follow-up s/p lumpectomy   INTERVAL HISTORY: Nicole Potter is a 60 y.o. with above-mentioned history of left breast cancer. She underwent a left lumpectomy on 04/24/20 with Dr. Marlou Potter for which pathology showed no residual invasive carcinoma, LCIS, and 3 left axillary lymph nodes negative for carcinoma. She presents to the clinic today to discuss the pathology report and further treatment.   ALLERGIES:  has No Known Allergies.  MEDICATIONS:  Current Outpatient Medications  Medication Sig Dispense Refill  . ALPRAZolam (XANAX) 0.5 MG tablet Take 0.5 mg by mouth daily as needed.    Marland Kitchen HYDROcodone-acetaminophen (NORCO/VICODIN) 5-325 MG tablet Take 1-2 tablets by mouth every 6 (six) hours as needed for moderate pain or severe pain. 15 tablet 0  . Multiple Vitamin (MULTIVITAMIN WITH MINERALS) TABS tablet Take 1 tablet by mouth daily.    . NP THYROID 90 MG tablet Take 90 mg by mouth at bedtime.    . phentermine 30 MG  capsule Take 30 mg by mouth every morning.    . Semaglutide-Weight Management (WEGOVY [place]) Inject into the skin.     No current facility-administered medications for this visit.    PHYSICAL EXAMINATION: ECOG PERFORMANCE STATUS: 1 - Symptomatic but completely ambulatory  There were no vitals filed for this visit. There were no vitals filed for this visit.   LABORATORY DATA:  I have reviewed the data as listed No flowsheet data found.  No results found for: WBC, HGB, HCT, MCV, PLT, NEUTROABS  ASSESSMENT & PLAN:  Malignant neoplasm of upper-outer quadrant of left breast in female, estrogen receptor positive ([place]) 03/21/2020:Screening mammogram detected left breast calcifications. Diagnostic mammogram on 03/02/20 showed two 0.6cm groups of calcifications in the left breast, spanning 2.0cm in total. Biopsy on 03/21/20 showed invasive mammary carcinoma with lobular and ductal features, HER-2 negative (0), ER+ 40% weak, PR+ 40% moderate, Ki67 10%.  04/24/20: Left lumpectomy Nicole Potter): no residual invasive carcinoma, LCIS, and 3 left axillary lymph nodes negative for carcinoma. (Size 2 mm on biopsy)  Pathology counseling: I discussed the final pathology report of the patient provided  a copy of this report. I discussed the margins as well as lymph node surgeries. We also discussed the final staging along with previously performed ER/PR and HER-2/neu testing.  Plan 1. Adj XRT 2. Followed by Adj Anti-estrogen therapy  RTC after XRT is complete     No orders of the defined types were placed in this encounter.  The patient has a good understanding of the overall plan. she agrees with it. she  will call with any problems that may develop before the next visit here.  Total time spent: 20 mins including face to face time and time spent for planning, charting and coordination of care  Rulon Eisenmenger, MD, MPH 05/01/2020  I, [name], am acting as scribe for Dr. Nicholas Lose.  I have reviewed  the above documentation for accuracy and completeness, and I agree with the above.

## 2020-05-01 ENCOUNTER — Inpatient Hospital Stay (HOSPITAL_BASED_OUTPATIENT_CLINIC_OR_DEPARTMENT_OTHER): Payer: BC Managed Care – PPO | Admitting: Hematology and Oncology

## 2020-05-01 DIAGNOSIS — Z170 Estrogen receptor positive status [ER+]: Secondary | ICD-10-CM

## 2020-05-01 DIAGNOSIS — C50412 Malignant neoplasm of upper-outer quadrant of left female breast: Secondary | ICD-10-CM

## 2020-05-02 ENCOUNTER — Telehealth: Payer: Self-pay | Admitting: Genetic Counselor

## 2020-05-02 NOTE — Telephone Encounter (Signed)
LM on VM that results were back and to please call. ?

## 2020-05-03 ENCOUNTER — Encounter: Payer: Self-pay | Admitting: *Deleted

## 2020-05-04 NOTE — Telephone Encounter (Signed)
Revealed that BRCAplus testing was negative.  We are still waiting on the remainder of the testing and will call when it is completed.

## 2020-05-18 ENCOUNTER — Telehealth: Payer: Self-pay | Admitting: Genetic Counselor

## 2020-05-18 ENCOUNTER — Encounter: Payer: Self-pay | Admitting: Genetic Counselor

## 2020-05-18 ENCOUNTER — Ambulatory Visit: Payer: Self-pay | Admitting: Genetic Counselor

## 2020-05-18 DIAGNOSIS — Z1379 Encounter for other screening for genetic and chromosomal anomalies: Secondary | ICD-10-CM | POA: Insufficient documentation

## 2020-05-18 NOTE — Telephone Encounter (Signed)
Revealed negative genetic testing.  Discussed that we do not know why she has breast cancer or why there is cancer in the family. It could be due to a different gene that we are not testing, or maybe our current technology may not be able to pick something up.  It will be important for her to keep in contact with genetics to keep up with whether additional testing may be needed.   One VUS but this will not change medical management.

## 2020-05-18 NOTE — Progress Notes (Addendum)
HPI:  Ms. Nicole Potter was previously seen in the [facility] due to a personal and family history of cancer and concerns regarding a hereditary predisposition to cancer. Please refer to our prior cancer genetics clinic note for more information regarding our discussion, assessment and recommendations, at the time. Ms. Nicole Potter recent genetic test results were disclosed to her, as were recommendations warranted by these results. These results and recommendations are discussed in more detail below.  CANCER HISTORY:  Oncology History  Malignant neoplasm of upper-outer quadrant of left breast in female, estrogen receptor positive ([facility])  03/21/2020 Initial Diagnosis   Screening mammogram detected left breast calcifications. Diagnostic mammogram on 03/02/20 showed two 0.6cm groups of calcifications in the left breast, spanning 2.0cm in total. Biopsy on 03/21/20 showed invasive mammary carcinoma with lobular and ductal features, HER-2 negative (0), ER+ 40% weak, PR+ 40% moderate, Ki67 10%.   04/24/2020 Surgery   Left lumpectomy Carolynne Edouard): no residual invasive carcinoma, LCIS, and 3 left axillary lymph nodes negative for carcinoma.   05/11/2020 Genetic Testing   Negative genetic testing.  CDC73 VUS identified.  The CancerNext-Expanded gene panel offered by [facility] and includes sequencing and rearrangement analysis for the following 77 genes: AIP, ALK, APC*, ATM*, AXIN2, BAP1, BARD1, BLM, BMPR1A, BRCA1*, BRCA2*, BRIP1*, CDC73, CDH1*, CDK4, CDKN1B, CDKN2A, CHEK2*, CTNNA1, DICER1, FANCC, FH, FLCN, GALNT12, KIF1B, LZTR1, MAX, MEN1, MET, MLH1*, MSH2*, MSH3, MSH6*, MUTYH*, NBN, NF1*, NF2, NTHL1, PALB2*, PHOX2B, PMS2*, POT1, PRKAR1A, PTCH1, PTEN*, RAD51C*, RAD51D*, RB1, RECQL, RET, SDHA, SDHAF2, SDHB, SDHC, SDHD, SMAD4, SMARCA4, SMARCB1, SMARCE1, STK11, SUFU, TMEM127, TP53*, TSC1, TSC2, VHL and XRCC2 (sequencing and deletion/duplication); EGFR, EGLN1, HOXB13, KIT, MITF, PDGFRA, POLD1, and POLE (sequencing  only); EPCAM and GREM1 (deletion/duplication only). DNA and RNA analyses performed for * genes. The report date is May 11, 2020.     FAMILY HISTORY:  We obtained a detailed, 4-generation family history.  Significant diagnoses are listed below: Family History  Problem Relation Age of Onset   Thyroid cancer Mother    Prostate cancer Father    Congestive Heart Failure Father    Skin cancer Father    Thyroid cancer Maternal Aunt 60   Throat cancer Paternal Grandfather    Cancer Cousin        unknown cancer - mat first cousin    The patient has one daughter who is cancer free. She has three sisters who are cancer free.  Her mother is living and her father is deceased.   The patient's father had prostate and skin cancer.  He had three brothers and a sister who are all cancer free.  His father died of possible throat cancer.   The patient's mother had thyroid cancer at an unknown age.  She had 10 siblings.  One sister had thyroid cancer around age 4.  There is one maternal cousin with an unknown cancer.   Ms. Nicole Potter is unaware of previous family history of genetic testing for hereditary cancer risks. Patient's maternal ancestors are of Caucasian descent, and paternal ancestors are of [place] descent. There is no reported Ashkenazi Jewish ancestry. There is no known consanguinity.     GENETIC TEST RESULTS: Genetic testing reported out on May 11, 2020 through the CancerNext-Expanded+RNAinsight cancer panel found no pathogenic mutations. The CancerNext-Expanded gene panel offered by [facility] and includes sequencing and rearrangement analysis for the following 77 genes: AIP, ALK, APC*, ATM*, AXIN2, BAP1, BARD1, BLM, BMPR1A, BRCA1*, BRCA2*, BRIP1*, CDC73, CDH1*, CDK4, CDKN1B, CDKN2A, CHEK2*, CTNNA1, DICER1,  FANCC, FH, FLCN, GALNT12, KIF1B, LZTR1, MAX, MEN1, MET, MLH1*, MSH2*, MSH3, MSH6*, MUTYH*, NBN, NF1*, NF2, NTHL1, PALB2*, PHOX2B, PMS2*, POT1, PRKAR1A, PTCH1, PTEN*, RAD51C*,  RAD51D*, RB1, RECQL, RET, SDHA, SDHAF2, SDHB, SDHC, SDHD, SMAD4, SMARCA4, SMARCB1, SMARCE1, STK11, SUFU, TMEM127, TP53*, TSC1, TSC2, VHL and XRCC2 (sequencing and deletion/duplication); EGFR, EGLN1, HOXB13, KIT, MITF, PDGFRA, POLD1, and POLE (sequencing only); EPCAM and GREM1 (deletion/duplication only). DNA and RNA analyses performed for * genes. The test report has been scanned into EPIC and is located under the Molecular Pathology section of the Results Review tab.  A portion of the result report is included below for reference.     We discussed with Ms. Nicole Potter that because current genetic testing is not perfect, it is possible there may be a gene mutation in one of these genes that current testing cannot detect, but that chance is small.  We also discussed, that there could be another gene that has not yet been discovered, or that we have not yet tested, that is responsible for the cancer diagnoses in the family. It is also possible there is a hereditary cause for the cancer in the family that Ms. Nicole Potter did not inherit and therefore was not identified in her testing.  Therefore, it is important to remain in touch with cancer genetics in the future so that we can continue to offer Ms. Nicole Potter the most up to date genetic testing.   Genetic testing did identify a variant of uncertain significance (VUS) was identified in the CDC73 gene called [id number]>C.  At this time, it is unknown if this variant is associated with increased cancer risk or if this is a normal finding, but most variants such as this get reclassified to being inconsequential. It should not be used to make medical management decisions. With time, we suspect the lab will determine the significance of this variant, if any. If we do learn more about it, we will try to contact Ms. Nicole Potter to discuss it further. However, it is important to stay in touch with [place] periodically and keep the address and phone number up to date.  UPDATE: CDC73 c.371-5T>C VUS has  been reclassified to Likely Benign.  The amended report date is 12/05/2022.  ADDITIONAL GENETIC TESTING: We discussed with Ms. Lippman that her genetic testing was fairly extensive.  If there are genes identified to increase cancer risk that can be analyzed in the future, we would be happy to discuss and coordinate this testing at that time.    CANCER SCREENING RECOMMENDATIONS: Ms. Irish test result is considered negative (normal).  This means that we have not identified a hereditary cause for her personal and family history of cancer at this time. Most cancers happen by chance and this negative test suggests that her cancer may fall into this category.    While reassuring, this does not definitively rule out a hereditary predisposition to cancer. It is still possible that there could be genetic mutations that are undetectable by current technology. There could be genetic mutations in genes that have not been tested or identified to increase cancer risk.  Therefore, it is recommended she continue to follow the cancer management and screening guidelines provided by her oncology and primary healthcare provider.   An individual's cancer risk and medical management are not determined by genetic test results alone. Overall cancer risk assessment incorporates additional factors, including personal medical history, family history, and any available genetic information that may result in a personalized plan for cancer prevention and surveillance  RECOMMENDATIONS FOR FAMILY MEMBERS:  Individuals in this family might be at some increased risk of developing cancer, over the general population risk, simply due to the family history of cancer.  We recommended women in this family have a yearly mammogram beginning at age 74, or 63 years younger than the earliest onset of cancer, an annual clinical breast exam, and perform monthly breast self-exams. Women in this family should also have a gynecological exam as recommended by  their primary provider. All family members should be referred for colonoscopy starting at age 80.  FOLLOW-UP: Lastly, we discussed with Ms. Stumpp that cancer genetics is a rapidly advancing field and it is possible that new genetic tests will be appropriate for her and/or her family members in the future. We encouraged her to remain in contact with cancer genetics on an annual basis so we can update her personal and family histories and let her know of advances in cancer genetics that may benefit this family.   Our contact number was provided. Ms. Flesner questions were answered to her satisfaction, and she knows she is welcome to call us at anytime with additional questions or concerns.   Maylon Cos, MS, [facility] Licensed, Certified Genetic Counselor [email or web address]

## 2020-05-23 ENCOUNTER — Encounter: Payer: Self-pay | Admitting: Radiation Oncology

## 2020-05-23 ENCOUNTER — Ambulatory Visit
Admission: RE | Admit: 2020-05-23 | Discharge: 2020-05-23 | Disposition: A | Discharge status: Home / Self Care | Payer: BC Managed Care – PPO | Source: Ambulatory Visit | Attending: Radiation Oncology | Admitting: Radiation Oncology

## 2020-05-23 ENCOUNTER — Other Ambulatory Visit: Payer: Self-pay

## 2020-05-23 VITALS — BP 120/69 | HR 66 | Temp 97.2°F | Resp 18 | Ht 69.5 in | Wt 223.1 lb

## 2020-05-23 DIAGNOSIS — Z8042 Family history of malignant neoplasm of prostate: Secondary | ICD-10-CM | POA: Diagnosis not present

## 2020-05-23 DIAGNOSIS — C50412 Malignant neoplasm of upper-outer quadrant of left female breast: Secondary | ICD-10-CM

## 2020-05-23 DIAGNOSIS — E039 Hypothyroidism, unspecified: Secondary | ICD-10-CM | POA: Insufficient documentation

## 2020-05-23 DIAGNOSIS — N6489 Other specified disorders of breast: Secondary | ICD-10-CM | POA: Insufficient documentation

## 2020-05-23 DIAGNOSIS — Z170 Estrogen receptor positive status [ER+]: Secondary | ICD-10-CM | POA: Insufficient documentation

## 2020-05-23 DIAGNOSIS — Z808 Family history of malignant neoplasm of other organs or systems: Secondary | ICD-10-CM | POA: Insufficient documentation

## 2020-05-23 DIAGNOSIS — Z79899 Other long term (current) drug therapy: Secondary | ICD-10-CM | POA: Insufficient documentation

## 2020-05-23 DIAGNOSIS — F419 Anxiety disorder, unspecified: Secondary | ICD-10-CM | POA: Insufficient documentation

## 2020-05-23 NOTE — Addendum Note (Signed)
Encounter addended by: Cori Razor, RN on: 05/23/2020 9:52 AM  Actions taken: Charge Capture section accepted

## 2020-05-23 NOTE — Progress Notes (Signed)
New Breast Cancer Diagnosis: Left Breast Cancer    Surgeon and surgical plan, if any: Dr. Marlou Starks -Lumpectomy 04/24/2020  Medical oncologist, treatment if any:   Dr. Lindi Adie  Family History of Breast/Ovarian/Prostate Cancer: Dad had prostate  Lymphedema issues, if any:    Pain issues, if any:  Continues to have some numbness under her arm.  SAFETY ISSUES: Prior radiation? No Pacemaker/ICD? No Possible current pregnancy? Postmenopausal Is the patient on methotrexate? No  Current Complaints / other details:

## 2020-05-23 NOTE — Progress Notes (Signed)
.  Radiation Oncology         [phone]) [phone] ________________________________  Name: Nicole Potter        MRN: [id number]  Date of Service: 05/23/2020 DOB: March 16, 1961  [id number], [place], MD  Nicholas Lose, MD     REFERRING PHYSICIAN: Nicholas Lose, MD   DIAGNOSIS: The encounter diagnosis was Malignant neoplasm of upper-outer quadrant of left breast in female, estrogen receptor positive ([place]).   HISTORY OF PRESENT ILLNESS: Nicole Potter is a 60 y.o. female originally seen in the multidisciplinary breast [facility] for a new diagnosis of left breast cancer. The patient was noted to have a screening detected group of calcifications, there were 2 separate groups but overall measured approximately 2 cm on diagnostic mammography and confirmed no adenopathy in the axilla.  She underwent a biopsy on 03/21/2020 which showed an invasive mammary carcinoma with lobular and ductal features as well as noninvasive features as well as atypical lobular hyperplasia and calcifications, the cancer was ER/PR positive HER-2 was negative and Ki-67 was 10%, grade was not reported.  A second biopsy of the left breast showed complex sclerosing lesion with flat epithelial atypia and lobular neoplasia and calcifications.  She has undergone an MRI of the breast on 04/07/2020 and final results show no mass in the right breast or either axilla and within the left upper outer quadrant there were postbiopsy changes and no new findings.  Since her last visit, the patient has undergone left lumpectomy with sentinel lymph node biopsy on 04/24/2020.  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  3 sampled sentinel lymph nodes were negative for carcinoma.  With this, there was no need for Oncotype scoring.  She is seen today to discuss adjuvant treatment options.     PREVIOUS RADIATION THERAPY: No   PAST MEDICAL HISTORY:  Past Medical History:  Diagnosis  Date  . Anxiety   . Breast cancer ([place]) 03/21/2020   left breast INVASIVE MAMMARY CA  . Family history of adverse reaction to anesthesia    mother is sensitive to anesthesia  . Family history of prostate cancer   . Family history of thyroid cancer   . Hypothyroidism        PAST SURGICAL HISTORY: Past Surgical History:  Procedure Laterality Date  . BREAST BIOPSY Left 03/21/2020  . BREAST LUMPECTOMY WITH RADIOACTIVE SEED AND SENTINEL LYMPH NODE BIOPSY Left 04/24/2020   Procedure: LEFT BREAST LUMPECTOMY  WITH RADIOACTIVE SEEDS X 2  AND LEFT AXILLARY SENTINEL LYMPH NODE BIOPSY X 3;  Surgeon: Jovita Kussmaul, MD;  Location: [facility];  Service: General;  Laterality: Left;  . KNEE SURGERY Left 1980  . TUBAL LIGATION  2004     FAMILY HISTORY:  Family History  Problem Relation Age of Onset  . Thyroid cancer Mother   . Prostate cancer Father   . Congestive Heart Failure Father   . Skin cancer Father   . Thyroid cancer Maternal Aunt 60  . Throat cancer Paternal Grandfather   . Cancer Cousin        unknown cancer - mat first cousin     SOCIAL HISTORY:  reports that she has never smoked. She has never used smokeless tobacco. She reports current alcohol use. She reports that she does not use drugs. The patient is accompanied by her daughter and lives in [place]. She works for a [employer] in [employer]. She's been able to  work remotely from home during the pandemic.   ALLERGIES: Patient has no known allergies.   MEDICATIONS:  Current Outpatient Medications  Medication Sig Dispense Refill  . ALPRAZolam (XANAX) 0.5 MG tablet Take 0.5 mg by mouth daily as needed.    Marland Kitchen HYDROcodone-acetaminophen (NORCO/VICODIN) 5-325 MG tablet Take 1-2 tablets by mouth every 6 (six) hours as needed for moderate pain or severe pain. 15 tablet 0  . Multiple Vitamin (MULTIVITAMIN WITH MINERALS) TABS tablet Take 1 tablet by mouth daily.    . NP THYROID 90 MG tablet Take 90  mg by mouth at bedtime.    . phentermine 30 MG capsule Take 30 mg by mouth every morning.    . Semaglutide-Weight Management (WEGOVY [place]) Inject into the skin.     No current facility-administered medications for this encounter.     REVIEW OF SYSTEMS: On review of systems, the patient reports that she is doing well without concerns since surgery. No other complaints about her breast are noted.   PHYSICAL EXAM:  Wt Readings from Last 3 Encounters:  04/24/20 219 lb 12.8 oz (99.7 kg)  04/11/20 221 lb 3.2 oz (100.3 kg)  04/10/20 221 lb 12.8 oz (100.6 kg)   Temp Readings from Last 3 Encounters:  04/24/20 97.7 F (36.5 C)  04/11/20 97.8 F (36.6 C)  04/10/20 99 F (37.2 C) (Temporal)   BP Readings from Last 3 Encounters:  04/24/20 126/69  04/11/20 125/69  04/10/20 (!) 115/56   Pulse Readings from Last 3 Encounters:  04/24/20 94  04/11/20 89  04/10/20 (!) 103    In general this is a well appearing Caucasian female in no acute distress. She's alert and oriented x4 and appropriate throughout the examination. Cardiopulmonary assessment is negative for acute distress and she exhibits normal effort. Bilateral breast exam is deferred.    ECOG = 0  0 - Asymptomatic (Fully active, able to carry on all predisease activities without restriction)  1 - Symptomatic but completely ambulatory (Restricted in physically strenuous activity but ambulatory and able to carry out work of a light or sedentary nature. For example, light housework, office work)  2 - Symptomatic, <50% in bed during the day (Ambulatory and capable of all self care but unable to carry out any work activities. Up and about more than 50% of waking hours)  3 - Symptomatic, >50% in bed, but not bedbound (Capable of only limited self-care, confined to bed or chair 50% or more of waking hours)  4 - Bedbound (Completely disabled. Cannot carry on any self-care. Totally confined to bed or chair)  5 - Death   Eustace Pen MM, Creech  RH, Tormey DC, et al. [phone]). "Toxicity and response criteria of the [facility]". [place] Oncol. 5 (6): 649-55    LABORATORY DATA:  No results found for: WBC, HGB, HCT, MCV, PLT No results found for: NA, K, CL, CO2 No results found for: ALT, AST, GGT, ALKPHOS, BILITOT    RADIOGRAPHY: NM Sentinel Node Inj-No Rpt (Breast)  Result Date: 04/24/2020 Sulfur colloid was injected by the nuclear medicine technologist for melanoma sentinel node.   MM Breast Surgical Specimen  Result Date: 04/24/2020 CLINICAL DATA:  Status post excision of the left breast following radioactive seed localization of 2 adjacent lesions. EXAM: SPECIMEN RADIOGRAPH OF THE LEFT BREAST COMPARISON:  Previous exam(s). FINDINGS: Status post excision of the left breast. Both radioactive seeds as well as the ribbon and coil shaped biopsy clips are within the center of  the specimen. IMPRESSION: Specimen radiograph of the left breast. Electronically Signed   By: Lajean Manes M.D.   On: 04/24/2020 09:33       IMPRESSION/PLAN: 1. 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 Dr. Lisbeth Renshaw discusses the final pathology findings and reviews the nature of left breast disease. Despite the favorable findings from her pathology report, Dr. Lisbeth Renshaw discusses the rationale for external radiotherapy to the breast  to reduce risks of local recurrence followed by antiestrogen therapy. We discussed the risks, benefits, short, and long term effects of radiotherapy, as well as the curative intent, and the patient is interested in proceeding. Dr. Lisbeth Renshaw discusses the delivery and logistics of radiotherapy and recommends 4 weeks of radiotherapy with deep inspiration breath hold technique to the left breast. Written consent is obtained and placed in the chart, a copy was provided to the patient. She will simulate on Wednesday morning this week. Treatment will start in the next week or  so.   In a visit lasting 45 minutes, greater than 50% of the time was spent face to face reviewing her case, as well as in preparation of, discussing, and coordinating the patient's care.  The above documentation reflects my direct findings during this shared patient visit. Please see the separate note by Dr. Lisbeth Renshaw on this date for the remainder of the patient's plan of care.    Carola Rhine, PAC

## 2020-05-24 ENCOUNTER — Other Ambulatory Visit: Payer: Self-pay

## 2020-05-24 ENCOUNTER — Ambulatory Visit
Admission: RE | Admit: 2020-05-24 | Discharge: 2020-05-24 | Disposition: A | Discharge status: Home / Self Care | Payer: BC Managed Care – PPO | Source: Ambulatory Visit | Attending: Radiation Oncology | Admitting: Radiation Oncology

## 2020-05-24 DIAGNOSIS — Z510 Encounter for antineoplastic radiation therapy: Secondary | ICD-10-CM | POA: Diagnosis not present

## 2020-05-24 DIAGNOSIS — C50412 Malignant neoplasm of upper-outer quadrant of left female breast: Secondary | ICD-10-CM | POA: Diagnosis not present

## 2020-05-24 DIAGNOSIS — Z170 Estrogen receptor positive status [ER+]: Secondary | ICD-10-CM | POA: Diagnosis not present

## 2020-05-30 ENCOUNTER — Telehealth: Payer: Self-pay | Admitting: Hematology and Oncology

## 2020-05-30 ENCOUNTER — Encounter: Payer: Self-pay | Admitting: *Deleted

## 2020-05-30 NOTE — Telephone Encounter (Signed)
Scheduled per 3/15 sch msg. Called and spoke with pt, confirmed 4/6 appt

## 2020-05-31 DIAGNOSIS — Z170 Estrogen receptor positive status [ER+]: Secondary | ICD-10-CM | POA: Diagnosis not present

## 2020-05-31 DIAGNOSIS — C50412 Malignant neoplasm of upper-outer quadrant of left female breast: Secondary | ICD-10-CM | POA: Diagnosis not present

## 2020-05-31 DIAGNOSIS — Z510 Encounter for antineoplastic radiation therapy: Secondary | ICD-10-CM | POA: Diagnosis not present

## 2020-06-01 ENCOUNTER — Other Ambulatory Visit: Payer: Self-pay

## 2020-06-01 ENCOUNTER — Ambulatory Visit
Admission: RE | Admit: 2020-06-01 | Discharge: 2020-06-01 | Disposition: A | Discharge status: Home / Self Care | Payer: BC Managed Care – PPO | Source: Ambulatory Visit | Attending: Radiation Oncology | Admitting: Radiation Oncology

## 2020-06-01 DIAGNOSIS — Z170 Estrogen receptor positive status [ER+]: Secondary | ICD-10-CM | POA: Diagnosis not present

## 2020-06-01 DIAGNOSIS — Z510 Encounter for antineoplastic radiation therapy: Secondary | ICD-10-CM | POA: Diagnosis not present

## 2020-06-01 DIAGNOSIS — C50412 Malignant neoplasm of upper-outer quadrant of left female breast: Secondary | ICD-10-CM | POA: Diagnosis not present

## 2020-06-01 NOTE — Progress Notes (Signed)
Pt here for patient teaching.  Pt given Radiation and You booklet, skin care instructions, Alra deodorant and Radiaplex gel.  Reviewed areas of pertinence such as fatigue, hair loss, skin changes, breast tenderness and breast swelling . Pt able to give teach back of to pat skin and use unscented/gentle soap,apply Radiaplex bid, avoid applying anything to skin within 4 hours of treatment, avoid wearing an under wire bra and to use an electric razor if they must shave. Pt verbalizes understanding of information given and will contact nursing with any questions or concerns.     Lysandra Loughmiller M. Mikelle Myrick RN, BSN      

## 2020-06-02 ENCOUNTER — Other Ambulatory Visit: Payer: Self-pay

## 2020-06-02 ENCOUNTER — Ambulatory Visit
Admission: RE | Admit: 2020-06-02 | Discharge: 2020-06-02 | Disposition: A | Discharge status: Home / Self Care | Payer: BC Managed Care – PPO | Source: Ambulatory Visit | Attending: Radiation Oncology | Admitting: Radiation Oncology

## 2020-06-02 DIAGNOSIS — Z170 Estrogen receptor positive status [ER+]: Secondary | ICD-10-CM

## 2020-06-02 DIAGNOSIS — C50412 Malignant neoplasm of upper-outer quadrant of left female breast: Secondary | ICD-10-CM | POA: Diagnosis not present

## 2020-06-02 DIAGNOSIS — Z510 Encounter for antineoplastic radiation therapy: Secondary | ICD-10-CM | POA: Diagnosis not present

## 2020-06-02 MED ORDER — RADIAPLEXRX EX GEL: Freq: Once | CUTANEOUS | Status: AC

## 2020-06-02 MED ORDER — ALRA NON-METALLIC DEODORANT (RAD-ONC)
1.0000 "application " | Freq: Once | TOPICAL | Status: AC
  Administered 2020-06-02: 1 via TOPICAL

## 2020-06-05 ENCOUNTER — Other Ambulatory Visit: Payer: Self-pay

## 2020-06-05 ENCOUNTER — Ambulatory Visit
Admission: RE | Admit: 2020-06-05 | Discharge: 2020-06-05 | Disposition: A | Discharge status: Home / Self Care | Payer: BC Managed Care – PPO | Source: Ambulatory Visit | Attending: Radiation Oncology | Admitting: Radiation Oncology

## 2020-06-05 DIAGNOSIS — Z510 Encounter for antineoplastic radiation therapy: Secondary | ICD-10-CM | POA: Diagnosis not present

## 2020-06-05 DIAGNOSIS — C50412 Malignant neoplasm of upper-outer quadrant of left female breast: Secondary | ICD-10-CM | POA: Diagnosis not present

## 2020-06-05 DIAGNOSIS — Z170 Estrogen receptor positive status [ER+]: Secondary | ICD-10-CM | POA: Diagnosis not present

## 2020-06-06 ENCOUNTER — Ambulatory Visit
Admission: RE | Admit: 2020-06-06 | Discharge: 2020-06-06 | Disposition: A | Discharge status: Home / Self Care | Payer: BC Managed Care – PPO | Source: Ambulatory Visit | Attending: Radiation Oncology | Admitting: Radiation Oncology

## 2020-06-06 DIAGNOSIS — Z510 Encounter for antineoplastic radiation therapy: Secondary | ICD-10-CM | POA: Diagnosis not present

## 2020-06-06 DIAGNOSIS — Z170 Estrogen receptor positive status [ER+]: Secondary | ICD-10-CM | POA: Diagnosis not present

## 2020-06-06 DIAGNOSIS — C50412 Malignant neoplasm of upper-outer quadrant of left female breast: Secondary | ICD-10-CM | POA: Diagnosis not present

## 2020-06-07 ENCOUNTER — Other Ambulatory Visit: Payer: Self-pay

## 2020-06-07 ENCOUNTER — Ambulatory Visit
Admission: RE | Admit: 2020-06-07 | Discharge: 2020-06-07 | Disposition: A | Discharge status: Home / Self Care | Payer: BC Managed Care – PPO | Source: Ambulatory Visit | Attending: Radiation Oncology | Admitting: Radiation Oncology

## 2020-06-07 DIAGNOSIS — Z510 Encounter for antineoplastic radiation therapy: Secondary | ICD-10-CM | POA: Diagnosis not present

## 2020-06-07 DIAGNOSIS — Z170 Estrogen receptor positive status [ER+]: Secondary | ICD-10-CM | POA: Diagnosis not present

## 2020-06-07 DIAGNOSIS — C50412 Malignant neoplasm of upper-outer quadrant of left female breast: Secondary | ICD-10-CM | POA: Diagnosis not present

## 2020-06-08 ENCOUNTER — Ambulatory Visit: Payer: BC Managed Care – PPO

## 2020-06-09 ENCOUNTER — Ambulatory Visit
Admission: RE | Admit: 2020-06-09 | Discharge: 2020-06-09 | Disposition: A | Discharge status: Home / Self Care | Payer: BC Managed Care – PPO | Source: Ambulatory Visit | Attending: Radiation Oncology | Admitting: Radiation Oncology

## 2020-06-09 ENCOUNTER — Other Ambulatory Visit: Payer: Self-pay

## 2020-06-09 DIAGNOSIS — Z510 Encounter for antineoplastic radiation therapy: Secondary | ICD-10-CM | POA: Diagnosis not present

## 2020-06-09 DIAGNOSIS — C50412 Malignant neoplasm of upper-outer quadrant of left female breast: Secondary | ICD-10-CM | POA: Diagnosis not present

## 2020-06-09 DIAGNOSIS — Z170 Estrogen receptor positive status [ER+]: Secondary | ICD-10-CM | POA: Diagnosis not present

## 2020-06-12 ENCOUNTER — Ambulatory Visit
Admission: RE | Admit: 2020-06-12 | Discharge: 2020-06-12 | Disposition: A | Discharge status: Home / Self Care | Payer: BC Managed Care – PPO | Source: Ambulatory Visit | Attending: Radiation Oncology | Admitting: Radiation Oncology

## 2020-06-12 ENCOUNTER — Other Ambulatory Visit: Payer: Self-pay

## 2020-06-12 DIAGNOSIS — Z510 Encounter for antineoplastic radiation therapy: Secondary | ICD-10-CM | POA: Diagnosis not present

## 2020-06-12 DIAGNOSIS — Z170 Estrogen receptor positive status [ER+]: Secondary | ICD-10-CM | POA: Diagnosis not present

## 2020-06-12 DIAGNOSIS — C50412 Malignant neoplasm of upper-outer quadrant of left female breast: Secondary | ICD-10-CM | POA: Diagnosis not present

## 2020-06-13 ENCOUNTER — Ambulatory Visit
Admission: RE | Admit: 2020-06-13 | Discharge: 2020-06-13 | Disposition: A | Discharge status: Home / Self Care | Payer: BC Managed Care – PPO | Source: Ambulatory Visit | Attending: Radiation Oncology | Admitting: Radiation Oncology

## 2020-06-13 DIAGNOSIS — Z510 Encounter for antineoplastic radiation therapy: Secondary | ICD-10-CM | POA: Diagnosis not present

## 2020-06-13 DIAGNOSIS — C50412 Malignant neoplasm of upper-outer quadrant of left female breast: Secondary | ICD-10-CM | POA: Diagnosis not present

## 2020-06-13 DIAGNOSIS — Z170 Estrogen receptor positive status [ER+]: Secondary | ICD-10-CM | POA: Diagnosis not present

## 2020-06-14 ENCOUNTER — Ambulatory Visit
Admission: RE | Admit: 2020-06-14 | Discharge: 2020-06-14 | Disposition: A | Discharge status: Home / Self Care | Payer: BC Managed Care – PPO | Source: Ambulatory Visit | Attending: Radiation Oncology | Admitting: Radiation Oncology

## 2020-06-14 ENCOUNTER — Other Ambulatory Visit: Payer: Self-pay

## 2020-06-14 DIAGNOSIS — C50412 Malignant neoplasm of upper-outer quadrant of left female breast: Secondary | ICD-10-CM | POA: Diagnosis not present

## 2020-06-14 DIAGNOSIS — Z170 Estrogen receptor positive status [ER+]: Secondary | ICD-10-CM | POA: Diagnosis not present

## 2020-06-14 DIAGNOSIS — Z510 Encounter for antineoplastic radiation therapy: Secondary | ICD-10-CM | POA: Diagnosis not present

## 2020-06-15 ENCOUNTER — Ambulatory Visit
Admission: RE | Admit: 2020-06-15 | Discharge: 2020-06-15 | Disposition: A | Discharge status: Home / Self Care | Payer: BC Managed Care – PPO | Source: Ambulatory Visit | Attending: Radiation Oncology | Admitting: Radiation Oncology

## 2020-06-15 DIAGNOSIS — Z510 Encounter for antineoplastic radiation therapy: Secondary | ICD-10-CM | POA: Diagnosis not present

## 2020-06-15 DIAGNOSIS — Z170 Estrogen receptor positive status [ER+]: Secondary | ICD-10-CM | POA: Diagnosis not present

## 2020-06-15 DIAGNOSIS — C50412 Malignant neoplasm of upper-outer quadrant of left female breast: Secondary | ICD-10-CM | POA: Diagnosis not present

## 2020-06-16 ENCOUNTER — Ambulatory Visit
Admission: RE | Admit: 2020-06-16 | Discharge: 2020-06-16 | Disposition: A | Discharge status: Home / Self Care | Payer: BC Managed Care – PPO | Source: Ambulatory Visit | Attending: Radiation Oncology | Admitting: Radiation Oncology

## 2020-06-16 ENCOUNTER — Other Ambulatory Visit: Payer: Self-pay

## 2020-06-16 ENCOUNTER — Ambulatory Visit: Payer: BC Managed Care – PPO

## 2020-06-16 DIAGNOSIS — Z510 Encounter for antineoplastic radiation therapy: Secondary | ICD-10-CM | POA: Diagnosis not present

## 2020-06-16 DIAGNOSIS — C50412 Malignant neoplasm of upper-outer quadrant of left female breast: Secondary | ICD-10-CM | POA: Diagnosis not present

## 2020-06-16 DIAGNOSIS — Z170 Estrogen receptor positive status [ER+]: Secondary | ICD-10-CM | POA: Diagnosis not present

## 2020-06-19 ENCOUNTER — Ambulatory Visit
Admission: RE | Admit: 2020-06-19 | Discharge: 2020-06-19 | Disposition: A | Discharge status: Home / Self Care | Payer: BC Managed Care – PPO | Source: Ambulatory Visit | Attending: Radiation Oncology | Admitting: Radiation Oncology

## 2020-06-19 ENCOUNTER — Other Ambulatory Visit: Payer: Self-pay

## 2020-06-19 DIAGNOSIS — Z510 Encounter for antineoplastic radiation therapy: Secondary | ICD-10-CM | POA: Diagnosis not present

## 2020-06-19 DIAGNOSIS — Z170 Estrogen receptor positive status [ER+]: Secondary | ICD-10-CM | POA: Diagnosis not present

## 2020-06-19 DIAGNOSIS — C50412 Malignant neoplasm of upper-outer quadrant of left female breast: Secondary | ICD-10-CM | POA: Diagnosis not present

## 2020-06-20 ENCOUNTER — Ambulatory Visit
Admission: RE | Admit: 2020-06-20 | Discharge: 2020-06-20 | Disposition: A | Discharge status: Home / Self Care | Payer: BC Managed Care – PPO | Source: Ambulatory Visit | Attending: Radiation Oncology | Admitting: Radiation Oncology

## 2020-06-20 DIAGNOSIS — Z170 Estrogen receptor positive status [ER+]: Secondary | ICD-10-CM | POA: Diagnosis not present

## 2020-06-20 DIAGNOSIS — C50412 Malignant neoplasm of upper-outer quadrant of left female breast: Secondary | ICD-10-CM | POA: Diagnosis not present

## 2020-06-20 DIAGNOSIS — Z510 Encounter for antineoplastic radiation therapy: Secondary | ICD-10-CM | POA: Diagnosis not present

## 2020-06-20 NOTE — Progress Notes (Signed)
Patient Care Team: Lujean Amel, MD as PCP - General (Family Medicine) Mauro Kaufmann, RN as Oncology Nurse Navigator Rockwell Germany, RN as Oncology Nurse Navigator  DIAGNOSIS:    ICD-10-CM   1. Malignant neoplasm of upper-outer quadrant of left breast in female, estrogen receptor positive ([place])  C50.412    Z17.0     SUMMARY OF ONCOLOGIC HISTORY: Oncology History  Malignant neoplasm of upper-outer quadrant of left breast in female, estrogen receptor positive ([place])  03/21/2020 Initial Diagnosis   Screening mammogram detected left breast calcifications. Diagnostic mammogram on 03/02/20 showed two 0.6cm groups of calcifications in the left breast, spanning 2.0cm in total. Biopsy on 03/21/20 showed invasive mammary carcinoma with lobular and ductal features, HER-2 negative (0), ER+ 40% weak, PR+ 40% moderate, Ki67 10%.   04/24/2020 Surgery   Left lumpectomy Marlou Starks): no residual invasive carcinoma, LCIS, and 3 left axillary lymph nodes negative for carcinoma.   05/11/2020 Genetic Testing   Negative genetic testing.  CDC73 VUS identified.  The CancerNext-Expanded gene panel offered by [facility] and includes sequencing and rearrangement analysis for the following 77 genes: AIP, ALK, APC*, ATM*, AXIN2, BAP1, BARD1, BLM, BMPR1A, BRCA1*, BRCA2*, BRIP1*, CDC73, CDH1*, CDK4, CDKN1B, CDKN2A, CHEK2*, CTNNA1, DICER1, FANCC, FH, FLCN, GALNT12, KIF1B, LZTR1, MAX, MEN1, MET, MLH1*, MSH2*, MSH3, MSH6*, MUTYH*, NBN, NF1*, NF2, NTHL1, PALB2*, PHOX2B, PMS2*, POT1, PRKAR1A, PTCH1, PTEN*, RAD51C*, RAD51D*, RB1, RECQL, RET, SDHA, SDHAF2, SDHB, SDHC, SDHD, SMAD4, SMARCA4, SMARCB1, SMARCE1, STK11, SUFU, TMEM127, TP53*, TSC1, TSC2, VHL and XRCC2 (sequencing and deletion/duplication); EGFR, EGLN1, HOXB13, KIT, MITF, PDGFRA, POLD1, and POLE (sequencing only); EPCAM and GREM1 (deletion/duplication only). DNA and RNA analyses performed for * genes. The report date is May 11, 2020.   06/02/2020 - 06/29/2020 Radiation  Therapy   Adjuvant radiation   07/16/2020 -  Anti-estrogen oral therapy   Anastrozole     CHIEF COMPLIANT: Follow-up to discuss antiestrogen therapy  INTERVAL HISTORY: Zephaniah K Dinius is a 60 y.o. with above-mentioned history of left breast cancer who underwent a left lumpectomy and is currently on radiation. She presents to the clinic today to discuss antiestrogen therapy.   ALLERGIES:  has No Known Allergies.  MEDICATIONS:  Current Outpatient Medications  Medication Sig Dispense Refill  . ALPRAZolam (XANAX) 0.5 MG tablet Take 0.5 mg by mouth daily as needed.    Marland Kitchen HYDROcodone-acetaminophen (NORCO/VICODIN) 5-325 MG tablet Take 1-2 tablets by mouth every 6 (six) hours as needed for moderate pain or severe pain. 15 tablet 0  . Multiple Vitamin (MULTIVITAMIN WITH MINERALS) TABS tablet Take 1 tablet by mouth daily.    . NP THYROID 90 MG tablet Take 90 mg by mouth at bedtime.    . phentermine 30 MG capsule Take 30 mg by mouth every morning.    . Semaglutide-Weight Management (WEGOVY ) Inject into the skin.     No current facility-administered medications for this visit.    PHYSICAL EXAMINATION: ECOG PERFORMANCE STATUS: 1 - Symptomatic but completely ambulatory  There were no vitals filed for this visit. There were no vitals filed for this visit.   LABORATORY DATA:  I have reviewed the data as listed No flowsheet data found.  No results found for: WBC, HGB, HCT, MCV, PLT, NEUTROABS  ASSESSMENT & PLAN:  Malignant neoplasm of upper-outer quadrant of left breast in female, estrogen receptor positive ([place]) 03/21/2020:Screening mammogram detected left breast calcifications. Diagnostic mammogram on 03/02/20 showed two 0.6cm groups of calcifications in the left breast, spanning 2.0cm in total. Biopsy  on 03/21/20 showed invasive mammary carcinoma with lobular and ductal features, HER-2 negative (0), ER+ 40% weak, PR+ 40% moderate, Ki67 10%.  04/24/20: Left lumpectomy Marlou Starks): no residual invasive  carcinoma, LCIS, and 3 left axillary lymph nodes negative for carcinoma. (Size 2 mm on biopsy)  Adjuvant radiation to be completed 06/29/2020  Anastrozole counseling: We discussed the risks and benefits of anti-estrogen therapy with aromatase inhibitors. These include but not limited to insomnia, hot flashes, mood changes, vaginal dryness, bone density loss, and weight gain. We strongly believe that the benefits far outweigh the risks. Patient understands these risks and consented to starting treatment. Planned treatment duration is 5-7 years. Anastrozole will begin 07/16/2020  We will obtain a bone density test in a month. Return to clinic in 3 months for survivorship care plan visit     No orders of the defined types were placed in this encounter.  The patient has a good understanding of the overall plan. she agrees with it. she will call with any problems that may develop before the next visit here.  Total time spent: 30 mins including face to face time and time spent for planning, charting and coordination of care  Rulon Eisenmenger, MD, MPH 06/21/2020  I, Cloyde Reams Dorshimer, am acting as scribe for Dr. Nicholas Lose.  I have reviewed the above documentation for accuracy and completeness, and I agree with the above.

## 2020-06-21 ENCOUNTER — Other Ambulatory Visit: Payer: Self-pay

## 2020-06-21 ENCOUNTER — Ambulatory Visit
Admission: RE | Admit: 2020-06-21 | Discharge: 2020-06-21 | Disposition: A | Discharge status: Home / Self Care | Payer: BC Managed Care – PPO | Source: Ambulatory Visit | Attending: Radiation Oncology | Admitting: Radiation Oncology

## 2020-06-21 ENCOUNTER — Inpatient Hospital Stay: Payer: BC Managed Care – PPO | Attending: Hematology and Oncology | Admitting: Hematology and Oncology

## 2020-06-21 VITALS — BP 122/72 | HR 91 | Temp 97.7°F | Resp 18 | Ht 69.5 in | Wt 222.7 lb

## 2020-06-21 DIAGNOSIS — Z780 Asymptomatic menopausal state: Secondary | ICD-10-CM

## 2020-06-21 DIAGNOSIS — Z510 Encounter for antineoplastic radiation therapy: Secondary | ICD-10-CM | POA: Diagnosis not present

## 2020-06-21 DIAGNOSIS — C50412 Malignant neoplasm of upper-outer quadrant of left female breast: Secondary | ICD-10-CM | POA: Diagnosis not present

## 2020-06-21 DIAGNOSIS — Z170 Estrogen receptor positive status [ER+]: Secondary | ICD-10-CM | POA: Insufficient documentation

## 2020-06-21 MED ORDER — ANASTROZOLE 1 MG PO TABS: 1.0000 mg | ORAL_TABLET | Freq: Every day | ORAL | 3 refills | Status: DC

## 2020-06-21 NOTE — Assessment & Plan Note (Signed)
03/21/2020:Screening mammogram detected left breast calcifications. Diagnostic mammogram on 03/02/20 showed two 0.6cm groups of calcifications in the left breast, spanning 2.0cm in total. Biopsy on 03/21/20 showed invasive mammary carcinoma with lobular and ductal features, HER-2 negative (0), ER+ 40% weak, PR+ 40% moderate, Ki67 10%.  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 (Size 2 mm on biopsy)  Adjuvant radiation to be completed 06/29/2020  Anastrozole counseling: We discussed the risks and benefits of anti-estrogen therapy with aromatase inhibitors. These include but not limited to insomnia, hot flashes, mood changes, vaginal dryness, bone density loss, and weight gain. We strongly believe that the benefits far outweigh the risks. Patient understands these risks and consented to starting treatment. Planned treatment duration is 5-7 years. Anastrozole will begin 07/16/2020  Return to clinic in 3 months for survivorship care plan visit

## 2020-06-22 ENCOUNTER — Ambulatory Visit
Admission: RE | Admit: 2020-06-22 | Discharge: 2020-06-22 | Disposition: A | Discharge status: Home / Self Care | Payer: BC Managed Care – PPO | Source: Ambulatory Visit | Attending: Radiation Oncology | Admitting: Radiation Oncology

## 2020-06-22 DIAGNOSIS — Z170 Estrogen receptor positive status [ER+]: Secondary | ICD-10-CM | POA: Diagnosis not present

## 2020-06-22 DIAGNOSIS — C50412 Malignant neoplasm of upper-outer quadrant of left female breast: Secondary | ICD-10-CM | POA: Diagnosis not present

## 2020-06-22 DIAGNOSIS — Z510 Encounter for antineoplastic radiation therapy: Secondary | ICD-10-CM | POA: Diagnosis not present

## 2020-06-23 ENCOUNTER — Ambulatory Visit
Admission: RE | Admit: 2020-06-23 | Discharge: 2020-06-23 | Disposition: A | Discharge status: Home / Self Care | Payer: BC Managed Care – PPO | Source: Ambulatory Visit | Attending: Radiation Oncology | Admitting: Radiation Oncology

## 2020-06-23 ENCOUNTER — Ambulatory Visit: Payer: BC Managed Care – PPO

## 2020-06-23 ENCOUNTER — Other Ambulatory Visit: Payer: Self-pay

## 2020-06-23 DIAGNOSIS — Z510 Encounter for antineoplastic radiation therapy: Secondary | ICD-10-CM | POA: Diagnosis not present

## 2020-06-23 DIAGNOSIS — Z170 Estrogen receptor positive status [ER+]: Secondary | ICD-10-CM | POA: Diagnosis not present

## 2020-06-23 DIAGNOSIS — C50412 Malignant neoplasm of upper-outer quadrant of left female breast: Secondary | ICD-10-CM | POA: Diagnosis not present

## 2020-06-26 ENCOUNTER — Ambulatory Visit: Payer: BC Managed Care – PPO

## 2020-06-26 ENCOUNTER — Ambulatory Visit
Admission: RE | Admit: 2020-06-26 | Discharge: 2020-06-26 | Disposition: A | Discharge status: Home / Self Care | Payer: BC Managed Care – PPO | Source: Ambulatory Visit | Attending: Radiation Oncology | Admitting: Radiation Oncology

## 2020-06-26 ENCOUNTER — Other Ambulatory Visit: Payer: Self-pay

## 2020-06-26 DIAGNOSIS — Z510 Encounter for antineoplastic radiation therapy: Secondary | ICD-10-CM | POA: Diagnosis not present

## 2020-06-26 DIAGNOSIS — Z170 Estrogen receptor positive status [ER+]: Secondary | ICD-10-CM | POA: Diagnosis not present

## 2020-06-26 DIAGNOSIS — C50412 Malignant neoplasm of upper-outer quadrant of left female breast: Secondary | ICD-10-CM | POA: Diagnosis not present

## 2020-06-27 ENCOUNTER — Ambulatory Visit
Admission: RE | Admit: 2020-06-27 | Discharge: 2020-06-27 | Disposition: A | Discharge status: Home / Self Care | Payer: BC Managed Care – PPO | Source: Ambulatory Visit | Attending: Radiation Oncology | Admitting: Radiation Oncology

## 2020-06-27 DIAGNOSIS — C50412 Malignant neoplasm of upper-outer quadrant of left female breast: Secondary | ICD-10-CM | POA: Diagnosis not present

## 2020-06-27 DIAGNOSIS — Z170 Estrogen receptor positive status [ER+]: Secondary | ICD-10-CM | POA: Diagnosis not present

## 2020-06-27 DIAGNOSIS — Z510 Encounter for antineoplastic radiation therapy: Secondary | ICD-10-CM | POA: Diagnosis not present

## 2020-06-28 ENCOUNTER — Encounter: Payer: Self-pay | Admitting: *Deleted

## 2020-06-28 ENCOUNTER — Ambulatory Visit
Admission: RE | Admit: 2020-06-28 | Discharge: 2020-06-28 | Disposition: A | Discharge status: Home / Self Care | Payer: BC Managed Care – PPO | Source: Ambulatory Visit | Attending: Radiation Oncology | Admitting: Radiation Oncology

## 2020-06-28 ENCOUNTER — Other Ambulatory Visit: Payer: Self-pay

## 2020-06-28 ENCOUNTER — Ambulatory Visit: Payer: BC Managed Care – PPO

## 2020-06-28 DIAGNOSIS — Z170 Estrogen receptor positive status [ER+]: Secondary | ICD-10-CM | POA: Diagnosis not present

## 2020-06-28 DIAGNOSIS — C50412 Malignant neoplasm of upper-outer quadrant of left female breast: Secondary | ICD-10-CM | POA: Diagnosis not present

## 2020-06-28 DIAGNOSIS — Z510 Encounter for antineoplastic radiation therapy: Secondary | ICD-10-CM | POA: Diagnosis not present

## 2020-06-29 ENCOUNTER — Ambulatory Visit
Admission: RE | Admit: 2020-06-29 | Discharge: 2020-06-29 | Disposition: A | Discharge status: Home / Self Care | Payer: BC Managed Care – PPO | Source: Ambulatory Visit | Attending: Radiation Oncology | Admitting: Radiation Oncology

## 2020-06-29 ENCOUNTER — Encounter: Payer: Self-pay | Admitting: Radiation Oncology

## 2020-06-29 DIAGNOSIS — Z510 Encounter for antineoplastic radiation therapy: Secondary | ICD-10-CM | POA: Diagnosis not present

## 2020-06-29 DIAGNOSIS — Z170 Estrogen receptor positive status [ER+]: Secondary | ICD-10-CM | POA: Diagnosis not present

## 2020-06-29 DIAGNOSIS — C50412 Malignant neoplasm of upper-outer quadrant of left female breast: Secondary | ICD-10-CM | POA: Diagnosis not present

## 2020-06-29 NOTE — Progress Notes (Signed)
                                                                                                                                                                 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  Intent: Curative  Radiation Treatment Dates: 06/01/2020 through 06/29/2020 Site Technique Total Dose (Gy) Dose per Fx (Gy) Completed Fx Beam Energies  Breast, Left: Breast_Lt 3D 42.56/42.56 2.66 16/16 6X  Breast, Left: Breast_Lt_Bst 3D 8/8 2 4/4 6X   Narrative: The patient tolerated radiation therapy relatively well. During treatment she noted fatigue and itching in the treatment field.  Plan: The patient will receive a call in about one month from the radiation oncology department. She will continue follow up with Dr. Lindi Adie as well.   ________________________________________________    Carola Rhine, [facility]

## 2020-07-05 ENCOUNTER — Encounter (HOSPITAL_COMMUNITY): Payer: Self-pay

## 2020-07-05 ENCOUNTER — Emergency Department (HOSPITAL_COMMUNITY)
Admission: EM | Admit: 2020-07-05 | Discharge: 2020-07-06 | Disposition: A | Discharge status: Home / Self Care | Payer: BC Managed Care – PPO | Attending: Emergency Medicine | Admitting: Emergency Medicine

## 2020-07-05 ENCOUNTER — Other Ambulatory Visit: Payer: Self-pay

## 2020-07-05 DIAGNOSIS — E860 Dehydration: Secondary | ICD-10-CM | POA: Insufficient documentation

## 2020-07-05 DIAGNOSIS — Z853 Personal history of malignant neoplasm of breast: Secondary | ICD-10-CM | POA: Insufficient documentation

## 2020-07-05 DIAGNOSIS — Z79899 Other long term (current) drug therapy: Secondary | ICD-10-CM | POA: Diagnosis not present

## 2020-07-05 DIAGNOSIS — R1110 Vomiting, unspecified: Secondary | ICD-10-CM | POA: Diagnosis not present

## 2020-07-05 DIAGNOSIS — E039 Hypothyroidism, unspecified: Secondary | ICD-10-CM | POA: Insufficient documentation

## 2020-07-05 LAB — COMPREHENSIVE METABOLIC PANEL
ALT: 26 U/L (ref 0–44)
AST: 35 U/L (ref 15–41)
Albumin: 4.7 g/dL (ref 3.5–5.0)
Alkaline Phosphatase: 104 U/L (ref 38–126)
Anion gap: 8 (ref 5–15)
BUN: 21 mg/dL — ABNORMAL HIGH (ref 6–20)
CO2: 29 mmol/L (ref 22–32)
Calcium: 10.2 mg/dL (ref 8.9–10.3)
Chloride: 105 mmol/L (ref 98–111)
Creatinine, Ser: 0.83 mg/dL (ref 0.44–1.00)
GFR, Estimated: >60 mL/min (ref >60)
Glucose, Bld: 98 mg/dL (ref 70–99)
Potassium: 3.8 mmol/L (ref 3.5–5.1)
Sodium: 142 mmol/L (ref 135–145)
Total Bilirubin: 0.8 mg/dL (ref 0.3–1.2)
Total Protein: 8.5 g/dL — ABNORMAL HIGH (ref 6.5–8.1)

## 2020-07-05 LAB — CBC WITH DIFFERENTIAL/PLATELET
Abs Immature Granulocytes: 0.02 10*3/uL (ref 0.00–0.07)
Basophils Absolute: 0 10*3/uL (ref 0.0–0.1)
Basophils Relative: 0 %
Eosinophils Absolute: 0 10*3/uL (ref 0.0–0.5)
Eosinophils Relative: 0 %
HCT: 45.3 % (ref 36.0–46.0)
Hemoglobin: 15 g/dL (ref 12.0–15.0)
Immature Granulocytes: 0 %
Lymphocytes Relative: 10 %
Lymphs Abs: 0.9 10*3/uL (ref 0.7–4.0)
MCH: 30.4 pg (ref 26.0–34.0)
MCHC: 33.1 g/dL (ref 30.0–36.0)
MCV: 91.7 fL (ref 80.0–100.0)
Monocytes Absolute: 0.6 10*3/uL (ref 0.1–1.0)
Monocytes Relative: 6 %
Neutro Abs: 7.4 10*3/uL (ref 1.7–7.7)
Neutrophils Relative %: 84 %
Platelets: 348 10*3/uL (ref 150–400)
RBC: 4.94 MIL/uL (ref 3.87–5.11)
RDW: 12.5 % (ref 11.5–15.5)
WBC: 9 10*3/uL (ref 4.0–10.5)
nRBC: 0 % (ref 0.0–0.2)

## 2020-07-05 LAB — URINALYSIS, ROUTINE W REFLEX MICROSCOPIC
Bilirubin Urine: NEGATIVE
Glucose, UA: NEGATIVE mg/dL
Hgb urine dipstick: NEGATIVE
Ketones, ur: 20 mg/dL — AB
Nitrite: NEGATIVE
Protein, ur: 100 mg/dL — AB
Specific Gravity, Urine: 1.032 — ABNORMAL HIGH (ref 1.005–1.030)
pH: 5 (ref 5.0–8.0)

## 2020-07-05 LAB — LIPASE, BLOOD: Lipase: 27 U/L (ref 11–51)

## 2020-07-05 MED ORDER — ONDANSETRON HCL 4 MG/2ML IJ SOLN
4.0000 mg | Freq: Once | INTRAMUSCULAR | Status: AC
  Administered 2020-07-05: 4 mg via INTRAVENOUS
  Filled 2020-07-05: qty 2

## 2020-07-05 MED ORDER — LACTATED RINGERS IV BOLUS
2000.0000 mL | Freq: Once | INTRAVENOUS | Status: AC
  Administered 2020-07-05: 2000 mL via INTRAVENOUS

## 2020-07-05 MED ORDER — FAMOTIDINE IN NACL 20-0.9 MG/50ML-% IV SOLN
20.0000 mg | Freq: Once | INTRAVENOUS | Status: AC
  Administered 2020-07-05: 20 mg via INTRAVENOUS
  Filled 2020-07-05: qty 50

## 2020-07-05 NOTE — ED Triage Notes (Signed)
Emergency Medicine Provider Triage Evaluation Note  Nicole Potter , a 60 y.o. female  was evaluated in triage.  Pt complains of nv that started 2 days ago after she had a weight loss shot. Denies abd pain. Denies diarrhea, fevers or urinary sxs.  States she finished radiation last week.   Review of Systems  Positive: nv Negative: abd pain, diarrhea, fevers, urinary  Physical Exam  BP (!) 146/73 (BP Location: Left Arm)   Pulse 96   Temp 98.1 F (36.7 C) (Oral)   Resp 18   Ht 5\' 9"  (1.753 m)   Wt 97.5 kg   LMP 05/27/2012   SpO2 100%   BMI 31.75 kg/m  Gen:   Awake, no distress   HEENT:  Atraumatic  Resp:  Normal effort  Cardiac:  Normal rate  Abd:   Nondistended, nontender  MSK:   Moves extremities without difficulty  Neuro:  Speech clear   Medical Decision Making  Medically screening exam initiated at 7:20 PM.  Appropriate orders placed.  Nicole Potter was informed that the remainder of the evaluation will be completed by another provider, this initial triage assessment does not replace that evaluation, and the importance of remaining in the ED until their evaluation is complete.  Clinical Impression   Nv, no abd pain  MSE was initiated and I personally evaluated the patient and placed orders (if any) at  7:20 PM on July 05, 2020.  The patient appears stable so that the remainder of the MSE may be completed by another provider.    Rodney Booze, [place] 07/05/20 1920

## 2020-07-05 NOTE — ED Notes (Signed)
Pt given specimen cup and instructed that we need a urine sample when she is able to provide.

## 2020-07-05 NOTE — ED Triage Notes (Signed)
Pt states she has been vomiting for two days and feels dehydrated, she states she took a weight loss injection two days ago, hasn't had it in awhile, and thinks that may be the cause

## 2020-07-06 MED ORDER — ONDANSETRON 4 MG PO TBDP: ORAL_TABLET | ORAL | 0 refills | Status: DC

## 2020-07-06 NOTE — ED Provider Notes (Signed)
[facility] DEPT Provider Note   CSN: [id number] Arrival date & time: 07/05/20  9485     History Chief Complaint  Patient presents with  . Emesis    Nicole Potter is a 60 y.o. female.  HPI Patient had a treatment for weight loss that is a weekly shot.  She reports after she got it she started getting vomiting every time she tries to eat something.  No associated pain.  No fever no diarrhea.  Reports after 2 days of symptoms she was concerned that she was getting dehydrated.  Reports her urine is starting to look a little dark.  She did not have any nausea medication to try at home.    Past Medical History:  Diagnosis Date  . Anxiety   . Breast cancer ([place]) 03/21/2020   left breast INVASIVE MAMMARY CA  . Family history of adverse reaction to anesthesia    mother is sensitive to anesthesia  . Family history of prostate cancer   . Family history of thyroid cancer   . Hypothyroidism     Patient Active Problem List   Diagnosis Date Noted  . Genetic testing 05/18/2020  . Family history of prostate cancer   . Family history of thyroid cancer   . Malignant neoplasm of upper-outer quadrant of left breast in female, estrogen receptor positive ([place]) 04/10/2020    Past Surgical History:  Procedure Laterality Date  . BREAST BIOPSY Left 03/21/2020  . BREAST LUMPECTOMY WITH RADIOACTIVE SEED AND SENTINEL LYMPH NODE BIOPSY Left 04/24/2020   Procedure: LEFT BREAST LUMPECTOMY  WITH RADIOACTIVE SEEDS X 2  AND LEFT AXILLARY SENTINEL LYMPH NODE BIOPSY X 3;  Surgeon: Jovita Kussmaul, MD;  Location: [facility];  Service: General;  Laterality: Left;  . KNEE SURGERY Left 1980  . TUBAL LIGATION  2004     OB History   No obstetric history on file.     Family History  Problem Relation Age of Onset  . Thyroid cancer Mother   . Prostate cancer Father   . Congestive Heart Failure Father   . Skin cancer Father   . Thyroid cancer Maternal Aunt 60  .  Throat cancer Paternal Grandfather   . Cancer Cousin        unknown cancer - mat first cousin    Social History   Tobacco Use  . Smoking status: Never Smoker  . Smokeless tobacco: Never Used  Vaping Use  . Vaping Use: Never used  Substance Use Topics  . Alcohol use: Yes    Comment: social  . Drug use: Never    Home Medications Prior to Admission medications   Medication Sig Start Date End Date Taking? Authorizing Provider  ondansetron (ZOFRAN ODT) 4 MG disintegrating tablet 4mg  ODT q4 hours prn nausea/vomit 07/06/20  Yes Mesner, Corene Cornea, MD  ALPRAZolam Duanne Moron) 0.5 MG tablet Take 0.5 mg by mouth daily as needed. 02/07/20   [provider]  anastrozole (ARIMIDEX) 1 MG tablet Take 1 tablet (1 mg total) by mouth daily. 06/21/20   Nicholas Lose, MD  Multiple Vitamin (MULTIVITAMIN WITH MINERALS) TABS tablet Take 1 tablet by mouth daily.    [provider]  NP THYROID 90 MG tablet Take 90 mg by mouth at bedtime. 03/02/20   [provider]  phentermine 30 MG capsule Take 30 mg by mouth every morning.    [provider]    Allergies    Patient has no known allergies.  Review  of Systems   Review of Systems 10 systems reviewed and negative except as per HPI Physical Exam Updated Vital Signs BP 132/69   Pulse 75   Temp 98.1 F (36.7 C) (Oral)   Resp 18   Ht 5\' 9"  (1.753 m)   Wt 97.5 kg   LMP 05/27/2012   SpO2 100%   BMI 31.75 kg/m   Physical Exam Constitutional:      Appearance: She is well-developed.  HENT:     Head: Normocephalic and atraumatic.  Eyes:     Extraocular Movements: Extraocular movements intact.     Conjunctiva/sclera: Conjunctivae normal.  Cardiovascular:     Rate and Rhythm: Normal rate and regular rhythm.     Heart sounds: Normal heart sounds.  Pulmonary:     Effort: Pulmonary effort is normal.     Breath sounds: Normal breath sounds.  Abdominal:     General: Bowel sounds are normal. There is no distension.      Palpations: Abdomen is soft.     Tenderness: There is no abdominal tenderness.  Musculoskeletal:        General: Normal range of motion.     Cervical back: Neck supple.  Skin:    General: Skin is warm and dry.  Neurological:     Mental Status: She is alert and oriented to person, place, and time.     GCS: GCS eye subscore is 4. GCS verbal subscore is 5. GCS motor subscore is 6.     Coordination: Coordination normal.  Psychiatric:        Mood and Affect: Mood normal.     ED Results / Procedures / Treatments   Labs (all labs ordered are listed, but only abnormal results are displayed) Labs Reviewed  COMPREHENSIVE METABOLIC PANEL - Abnormal; Notable for the following components:      Result Value   BUN 21 (*)    Total Protein 8.5 (*)    All other components within normal limits  URINALYSIS, ROUTINE W REFLEX MICROSCOPIC - Abnormal; Notable for the following components:   APPearance TURBID (*)    Specific Gravity, Urine 1.032 (*)    Ketones, ur 20 (*)    Protein, ur 100 (*)    Leukocytes,Ua SMALL (*)    Bacteria, UA RARE (*)    All other components within normal limits  LIPASE, BLOOD  CBC WITH DIFFERENTIAL/PLATELET    EKG None  Radiology No results found.  Procedures Procedures   Medications Ordered in ED Medications  lactated ringers bolus 2,000 mL (2,000 mLs Intravenous New Bag/Given 07/05/20 2134)  ondansetron (ZOFRAN) injection 4 mg (4 mg Intravenous Given 07/05/20 2134)  famotidine (PEPCID) IVPB 20 mg premix (20 mg Intravenous New Bag/Given 07/05/20 2134)    ED Course  I have reviewed the triage vital signs and the nursing notes.  Pertinent labs & imaging results that were available during my care of the patient were reviewed by me and considered in my medical decision making (see chart for details).    MDM Rules/Calculators/A&P                          Patient presents as outlined.  She is clinically well in appearance.  Mild concentration of the urine  suggesting mild dehydration.  Plan is to rehydrate with 2 L lactated Ringer's.  Patient has no abdominal pain.  No signs of surgical abdomen.  Vital signs are normal.  This time after hydration stable for  discharge.  Plan will be for Pepcid twice daily for 2 weeks and Zofran as needed.  Close follow-up with weight loss clinic and family doctor for reassessment. Final Clinical Impression(s) / ED Diagnoses Final diagnoses:  Dehydration    Rx / DC Orders ED Discharge Orders         Ordered    ondansetron (ZOFRAN ODT) 4 MG disintegrating tablet        07/06/20 0007           Charlesetta Shanks, MD 07/06/20 0010

## 2020-07-06 NOTE — Discharge Instructions (Addendum)
1.  You can start taking over-the-counter Pepcid twice daily.  Take this for 2 weeks.  Take Zofran if needed for nausea. 2.  Start with small amounts of liquids taking frequent sips for the next 24 hours.  If you are tolerating this you may start with very bland foods 3.  Follow-up with your family doctor.

## 2020-07-06 NOTE — ED Notes (Signed)
PIV in left ac occluded, will not flush.  Upon removal catheter appeared bent.  Started new IV in right ac so patient could receive the remainder of the fluid boluses.

## 2020-09-11 ENCOUNTER — Other Ambulatory Visit: Payer: Self-pay

## 2020-09-11 ENCOUNTER — Ambulatory Visit
Admission: RE | Admit: 2020-09-11 | Discharge: 2020-09-11 | Disposition: A | Discharge status: Home / Self Care | Payer: BC Managed Care – PPO | Source: Ambulatory Visit | Attending: Radiation Oncology | Admitting: Radiation Oncology

## 2020-09-11 DIAGNOSIS — Z170 Estrogen receptor positive status [ER+]: Secondary | ICD-10-CM | POA: Insufficient documentation

## 2020-09-11 DIAGNOSIS — C50412 Malignant neoplasm of upper-outer quadrant of left female breast: Secondary | ICD-10-CM | POA: Insufficient documentation

## 2020-09-12 NOTE — Progress Notes (Signed)
  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   Interval Since Last Radiation:  11 weeks   06/01/2020 through 06/29/2020 Site Technique Total Dose (Gy) Dose per Fx (Gy) Completed Fx Beam Energies  Breast, Left: Breast_Lt 3D 42.56/42.56 2.66 16/16 6X  Breast, Left: Breast_Lt_Bst 3D 8/8 2 4/4 6X    Narrative:  The patient was contacted today for routine follow-up. During treatment she did very well with radiotherapy and did not have significant desquamation. She reports she is doing well reports her skin is almost normal in appearance.   Impression/Plan: 1. 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 . The patient has been doing well since completion of radiotherapy. We discussed that we would be happy to continue to follow her as needed, but she will also continue to follow up with Dr. Lindi Adie in medical oncology. She was counseled on skin care as well as measures to avoid sun exposure to this area.  2. Survivorship. We discussed the importance of survivorship evaluation and encouraged her to attend her upcoming visit with that clinic.       Carola Rhine, PAC

## 2020-09-26 ENCOUNTER — Telehealth: Payer: Self-pay | Admitting: Adult Health

## 2020-09-26 NOTE — Telephone Encounter (Signed)
Rescheduled per provider. Called pt and left a msg

## 2020-09-28 ENCOUNTER — Encounter: Payer: BC Managed Care – PPO | Admitting: Adult Health

## 2020-10-23 ENCOUNTER — Inpatient Hospital Stay: Payer: BC Managed Care – PPO | Attending: Adult Health | Admitting: Adult Health

## 2020-10-23 ENCOUNTER — Encounter: Payer: Self-pay | Admitting: Adult Health

## 2020-10-23 ENCOUNTER — Other Ambulatory Visit: Payer: Self-pay

## 2020-10-23 VITALS — BP 118/53 | HR 80 | Temp 97.9°F | Resp 18 | Ht 69.0 in | Wt 221.0 lb

## 2020-10-23 DIAGNOSIS — Z170 Estrogen receptor positive status [ER+]: Secondary | ICD-10-CM | POA: Diagnosis not present

## 2020-10-23 DIAGNOSIS — C50412 Malignant neoplasm of upper-outer quadrant of left female breast: Secondary | ICD-10-CM | POA: Insufficient documentation

## 2020-10-23 DIAGNOSIS — Z79811 Long term (current) use of aromatase inhibitors: Secondary | ICD-10-CM | POA: Diagnosis not present

## 2020-10-23 NOTE — Progress Notes (Signed)
SURVIVORSHIP VISIT:    BRIEF ONCOLOGIC HISTORY:  Oncology History  Malignant neoplasm of upper-outer quadrant of left breast in female, estrogen receptor positive ([place])  03/21/2020 Initial Diagnosis   Screening mammogram detected left breast calcifications. Diagnostic mammogram on 03/02/20 showed two 0.6cm groups of calcifications in the left breast, spanning 2.0cm in total. Biopsy on 03/21/20 showed invasive mammary carcinoma with lobular and ductal features, HER-2 negative (0), ER+ 40% weak, PR+ 40% moderate, Ki67 10%.   03/21/2020 Cancer Staging   Staging form: Breast, AJCC 8th Edition - Clinical stage from 03/21/2020: Stage IA (cT1b, cN0, cM0, G2, ER+, PR+, HER2-) - Signed by Gardenia Phlegm, NP on 10/18/2020  Stage prefix: Initial diagnosis  Histologic grading system: 3 grade system    04/24/2020 Surgery   Left lumpectomy Marlou Starks): no residual invasive carcinoma, LCIS, and 3 left axillary lymph nodes negative for carcinoma.   04/24/2020 Cancer Staging   Staging form: Breast, AJCC 8th Edition - Pathologic stage from 04/24/2020: pT0, pN0, cM0 - Signed by Gardenia Phlegm, NP on 10/18/2020  Stage prefix: Initial diagnosis    05/11/2020 Genetic Testing   Negative genetic testing.  CDC73 VUS identified.  The CancerNext-Expanded gene panel offered by [facility] and includes sequencing and rearrangement analysis for the following 77 genes: AIP, ALK, APC*, ATM*, AXIN2, BAP1, BARD1, BLM, BMPR1A, BRCA1*, BRCA2*, BRIP1*, CDC73, CDH1*, CDK4, CDKN1B, CDKN2A, CHEK2*, CTNNA1, DICER1, FANCC, FH, FLCN, GALNT12, KIF1B, LZTR1, MAX, MEN1, MET, MLH1*, MSH2*, MSH3, MSH6*, MUTYH*, NBN, NF1*, NF2, NTHL1, PALB2*, PHOX2B, PMS2*, POT1, PRKAR1A, PTCH1, PTEN*, RAD51C*, RAD51D*, RB1, RECQL, RET, SDHA, SDHAF2, SDHB, SDHC, SDHD, SMAD4, SMARCA4, SMARCB1, SMARCE1, STK11, SUFU, TMEM127, TP53*, TSC1, TSC2, VHL and XRCC2 (sequencing and deletion/duplication); EGFR, EGLN1, HOXB13, KIT, MITF, PDGFRA, POLD1, and POLE  (sequencing only); EPCAM and GREM1 (deletion/duplication only). DNA and RNA analyses performed for * genes. The report date is May 11, 2020.   06/01/2020 - 06/29/2020 Radiation Therapy   Adjuvant radiation  06/01/2020 through 06/29/2020 Site Technique Total Dose (Gy) Dose per Fx (Gy) Completed Fx Beam Energies  Breast, Left: Breast_Lt 3D 42.56/42.56 2.66 16/16 6X  Breast, Left: Breast_Lt_Bst 3D 8/8 2 4/4 6X    07/16/2020 -  Anti-estrogen oral therapy   Anastrozole     INTERVAL HISTORY:  Ms. Souders to review her survivorship care plan detailing her treatment course for breast cancer, as well as monitoring long-term side effects of that treatment, education regarding health maintenance, screening, and overall wellness and health promotion.     Overall, Ms. Stooksbury reports feeling quite well.  She has noted some increased hot flashes, and feels as if they are worsening.  They are worse at night and says that they interfere with her sleep.  She notes intermittent fullness in her breasts.    REVIEW OF SYSTEMS:  Review of Systems  Constitutional:  Positive for fatigue (mild, 2/10). Negative for appetite change, chills, fever and unexpected weight change.  HENT:   Negative for hearing loss, lump/mass and trouble swallowing.   Eyes:  Negative for eye problems and icterus.  Respiratory:  Negative for chest tightness, cough and shortness of breath.   Cardiovascular:  Negative for chest pain, leg swelling and palpitations.  Gastrointestinal:  Negative for abdominal distention, abdominal pain, constipation, diarrhea, nausea and vomiting.  Endocrine: Positive for hot flashes.  Genitourinary:  Negative for difficulty urinating.   Musculoskeletal:  Negative for arthralgias.  Skin:  Negative for itching and rash.  Neurological:  Negative for dizziness, extremity weakness, headaches and numbness.  Hematological:  Negative for adenopathy. Does not bruise/bleed easily.  Psychiatric/Behavioral:  Positive for  sleep disturbance. Negative for depression. The patient is not nervous/anxious.   Breast: Denies any new nodularity, masses, tenderness, nipple changes, or nipple discharge.   ONCOLOGY TREATMENT TEAM:  1. Surgeon:  Dr. Marlou Starks at [facility] 2. Medical Oncologist: Dr. Lindi Adie  3. Radiation Oncologist: Dr. Lisbeth Renshaw    PAST MEDICAL/SURGICAL HISTORY:  Past Medical History:  Diagnosis Date   Anxiety    Breast cancer ([place]) 03/21/2020   left breast INVASIVE MAMMARY CA   Family history of adverse reaction to anesthesia    mother is sensitive to anesthesia   Family history of prostate cancer    Family history of thyroid cancer    Hypothyroidism    Past Surgical History:  Procedure Laterality Date   BREAST BIOPSY Left 03/21/2020   BREAST LUMPECTOMY WITH RADIOACTIVE SEED AND SENTINEL LYMPH NODE BIOPSY Left 04/24/2020   Procedure: LEFT BREAST LUMPECTOMY  WITH RADIOACTIVE SEEDS X 2  AND LEFT AXILLARY SENTINEL LYMPH NODE BIOPSY X 3;  Surgeon: Jovita Kussmaul, MD;  Location: [place];  Service: General;  Laterality: Left;   [place]  2004     ALLERGIES:  No Known Allergies   CURRENT MEDICATIONS:  Outpatient Encounter Medications as of 10/23/2020  Medication Sig Note   ALPRAZolam (XANAX) 0.5 MG tablet Take 0.5 mg by mouth daily as needed.    anastrozole (ARIMIDEX) 1 MG tablet Take 1 tablet (1 mg total) by mouth daily. 07/06/2020: Due to start Jul 16, 2020   Multiple Vitamin (MULTIVITAMIN WITH MINERALS) TABS tablet Take 1 tablet by mouth daily.    NP THYROID 90 MG tablet Take 90 mg by mouth at bedtime.    phentermine 30 MG capsule Take 30 mg by mouth daily as needed (weightloss).    [DISCONTINUED] ondansetron (ZOFRAN ODT) 4 MG disintegrating tablet 9m ODT q4 hours prn nausea/vomit    [DISCONTINUED] Semaglutide-Weight Management (WEGOVY) 2.4 MG/0.75ML SOAJ Inject 2.4 mg into the skin once a week.    No facility-administered encounter  medications on file as of 10/23/2020.     ONCOLOGIC FAMILY HISTORY:  Family History  Problem Relation Age of Onset   Thyroid cancer Mother    Prostate cancer Father    Congestive Heart Failure Father    Skin cancer Father    Thyroid cancer Maternal Aunt 625  Throat cancer Paternal Grandfather    Cancer Cousin        unknown cancer - mat first cousin     GENETIC COUNSELING/TESTING: See above  SOCIAL HISTORY:  Social History   Socioeconomic History   Marital status: Unknown    Spouse name: Not on file   Number of children: Not on file   Years of education: Not on file   Highest education level: Not on file  Occupational History   Not on file  Tobacco Use   Smoking status: Never   Smokeless tobacco: Never  Vaping Use   Vaping Use: Never used  Substance and Sexual Activity   Alcohol use: Yes    Comment: social   Drug use: Never   Sexual activity: Not on file  Other Topics Concern   Not on file  Social History Narrative   Not on file   Social Determinants of Health   Financial Resource Strain: Not on file  Food Insecurity: Not on file  Transportation Needs: Not on file  Physical Activity: Not on file  Stress: Not on file  Social Connections: Not on file  Intimate Partner Violence: Not At Risk   Fear of Current or Ex-Partner: No   Emotionally Abused: No   Physically Abused: No   Sexually Abused: No     OBSERVATIONS/OBJECTIVE:  BP (!) 118/53 (BP Location: Left Arm, Patient Position: Sitting)   Pulse 80   Temp 97.9 F (36.6 C) (Tympanic)   Resp 18   Ht 5' 9"  (1.753 m)   Wt 221 lb (100.2 kg)   LMP 05/27/2012   SpO2 100%   BMI 32.64 kg/m  GENERAL: Patient is a well appearing female in no acute distress HEENT:  Sclerae anicteric.  Oropharynx clear and moist. No ulcerations or evidence of oropharyngeal candidiasis. Neck is supple.  NODES:  No cervical, supraclavicular, or axillary lymphadenopathy palpated.  BREAST EXAM:  right breast benign, left breast  s/p lumpectomy and radiation, no sign of bresat cancer recurrence LUNGS:  Clear to auscultation bilaterally.  No wheezes or rhonchi. HEART:  Regular rate and rhythm. No murmur appreciated. ABDOMEN:  Soft, nontender.  Positive, normoactive bowel sounds. No organomegaly palpated. MSK:  No focal spinal tenderness to palpation. Full range of motion bilaterally in the upper extremities. EXTREMITIES:  No peripheral edema.   SKIN:  Clear with no obvious rashes or skin changes. No nail dyscrasia. NEURO:  Nonfocal. Well oriented.  Appropriate affect.   LABORATORY DATA:  None for this visit.  DIAGNOSTIC IMAGING:  None for this visit.      ASSESSMENT AND PLAN:  Ms.. Vasudevan is a pleasant 60 y.o. female with Stage IA left breast invasive ductal carcinoma, ER+/PR+/HER2-, diagnosed in 03/2020, treated with lumpectomy, adjuvant radiation therapy, and anti-estrogen therapy with Anastrozole beginning in 07/2020.  She presents to the Survivorship Clinic for our initial meeting and routine follow-up post-completion of treatment for breast cancer.    1. Stage IA left breast cancer:  Ms. Briones is continuing to recover from definitive treatment for breast cancer. She will follow-up with her medical oncologist, Dr. Lindi Adie in 6 months with history and physical exam per surveillance protocol.  She will continue her anti-estrogen therapy with Anastrozole. Thus far, she is tolerating the Anastrozole well, with minimal side effects. She was instructed to make Dr. Lindi Adie or myself aware if she begins to experience any worsening side effects of the medication and I could see her back in clinic to help manage those side effects, as needed. Her mammogram is due 02/2021; orders placed today. Today, a comprehensive survivorship care plan and treatment summary was reviewed with the patient today detailing her breast cancer diagnosis, treatment course, potential late/long-term effects of treatment, appropriate follow-up care with  recommendations for the future, and patient education resources.  A copy of this summary, along with a letter will be sent to the patient's primary care provider via mail/fax/In Basket message after today's visit.    2. Hot Flashes: we reviewed non pharmacologic interventions to alleviate hot flashes.  Since she experiences these mainly at night and they are interfering with her sleep, I recommended that she change the time of day she takes the Anastrozole to see if that might help.  We reviewed pharmacologic interventions briefly, and she knows these are a possibility if they persist/worsen and she desires more intervention.  3. Breast Fullness: This is in her left breast and is consistent with radiation changes.  I recommended she massage her breast with vitamin e oil or coconut oil after showering  and wear a compression bra.  This fullness is intermittent and slowly improving.   4. Bone health:  Given Ms. Rickel's age/history of breast cancer and her current treatment regimen including anti-estrogen therapy with Anastrozole, she is at risk for bone demineralization.  She is scheduled for bone density testing on 12/20/2020.  In the meantime, she was encouraged to increase her consumption of foods rich in calcium, as well as increase her weight-bearing activities.  She was given education on specific activities to promote bone health.  5. Cancer screening:  Due to Ms. Doiron's history and her age, she should receive screening for skin cancers, colon cancer, and gynecologic cancers.  The information and recommendations are listed on the patient's comprehensive care plan/treatment summary and were reviewed in detail with the patient.    6. Health maintenance and wellness promotion: Ms. Gardiner was encouraged to consume 5-7 servings of fruits and vegetables per day. We reviewed the "Nutrition Rainbow" handout, as well as the handout "Take Control of Your Health and Reduce Your Cancer Risk" from the [facility].  She was also encouraged to engage in moderate to vigorous exercise for 30 minutes per day most days of the week. We discussed the [employer] fitness program, which is designed for cancer survivors to help them become more physically fit after cancer treatments.  She was instructed to limit her alcohol consumption and continue to abstain from tobacco use.  We reviewed her vaccinations.  She never wants a COVID vaccine.  She is due for TDAP and Pneumonia.  She is also due for her cologuard and HIV/HEP C screening.  She plans on reviewing this with Dr. Rica Mote her PCP.     7. Support services/counseling: It is not uncommon for this period of the patient's cancer care trajectory to be one of many emotions and stressors.  We discussed how this can be increasingly difficult during the times of quarantine and social distancing due to the COVID-19 pandemic.   She was given information regarding our available services and encouraged to contact me with any questions or for help enrolling in any of our support group/programs.    Follow up instructions:    -Return to cancer center in 6 months for f/u with Dr. Lindi Adie  -Mammogram due in 01/2021 -Bone density 12/20/2020 -Follow up with surgery one year -She is welcome to return back to the Survivorship Clinic at any time; no additional follow-up needed at this time.  -Consider referral back to survivorship as a long-term survivor for continued surveillance  The patient was provided an opportunity to ask questions and all were answered. The patient agreed with the plan and demonstrated an understanding of the instructions.   Total encounter time: 45 minutes in chart review, lab review, order entry, face to face visit time, SCP preparation, care coordination, and documentation of the encounter.   Wilber Bihari, NP 10/23/20 8:43 AM Medical Oncology and Hematology [facility] [address] [place], [place] [address] Tel. [phone]     Fax. [phone]  *Total Encounter Time as defined by the Centers for Medicare and Medicaid Services includes, in addition to the face-to-face time of a patient visit (documented in the note above) non-face-to-face time: obtaining and reviewing outside history, ordering and reviewing medications, tests or procedures, care coordination (communications with other health care professionals or caregivers) and documentation in the medical record.

## 2020-12-20 ENCOUNTER — Other Ambulatory Visit: Payer: Self-pay

## 2020-12-20 ENCOUNTER — Ambulatory Visit
Admission: RE | Admit: 2020-12-20 | Discharge: 2020-12-20 | Disposition: A | Discharge status: Home / Self Care | Payer: BC Managed Care – PPO | Source: Ambulatory Visit | Attending: Hematology and Oncology | Admitting: Hematology and Oncology

## 2020-12-20 DIAGNOSIS — M8589 Other specified disorders of bone density and structure, multiple sites: Secondary | ICD-10-CM | POA: Diagnosis not present

## 2020-12-20 DIAGNOSIS — Z780 Asymptomatic menopausal state: Secondary | ICD-10-CM | POA: Diagnosis not present

## 2021-02-07 ENCOUNTER — Other Ambulatory Visit: Payer: Self-pay

## 2021-02-07 ENCOUNTER — Ambulatory Visit
Admission: RE | Admit: 2021-02-07 | Discharge: 2021-02-07 | Disposition: A | Discharge status: Home / Self Care | Payer: BC Managed Care – PPO | Source: Ambulatory Visit | Attending: Adult Health | Admitting: Adult Health

## 2021-02-07 DIAGNOSIS — C50412 Malignant neoplasm of upper-outer quadrant of left female breast: Secondary | ICD-10-CM

## 2021-02-07 DIAGNOSIS — R922 Inconclusive mammogram: Secondary | ICD-10-CM | POA: Diagnosis not present

## 2021-02-15 DIAGNOSIS — Z01419 Encounter for gynecological examination (general) (routine) without abnormal findings: Secondary | ICD-10-CM | POA: Diagnosis not present

## 2021-02-15 DIAGNOSIS — Z853 Personal history of malignant neoplasm of breast: Secondary | ICD-10-CM | POA: Diagnosis not present

## 2021-02-15 DIAGNOSIS — Z6831 Body mass index (BMI) 31.0-31.9, adult: Secondary | ICD-10-CM | POA: Diagnosis not present

## 2021-04-24 NOTE — Assessment & Plan Note (Signed)
03/21/2020:Screening mammogram detected left breast calcifications. Diagnostic mammogram on 03/02/20 showed two 0.6cm groups of calcifications in the left breast, spanning 2.0cm in total. Biopsy on 03/21/20 showed invasive mammary carcinoma with lobular and ductal features, HER-2 negative (0), ER+ 40% weak, PR+ 40% moderate, Ki67 10%.  04/24/20:Left lumpectomy Nicole Potter): no residual invasive carcinoma, LCIS, and 3 left axillary lymph nodes negative for carcinoma.(Size 2 mm on biopsy)  Adjuvant radiation to be completed 06/29/2020  Anastrozole Toxicities:  Breast Cancer Surveillance: 1. Breast Exam: 04/25/21: Benign 2. Mammogram: 02/07/21: Benign, Density B  RTC in 1 year

## 2021-04-24 NOTE — Progress Notes (Signed)
Patient Care Team: Lujean Amel, MD as PCP - General (Family Medicine) Nicholas Lose, MD as Consulting Physician (Hematology and Oncology) Kyung Rudd, MD as Consulting Physician (Radiation Oncology) Jovita Kussmaul, MD as Consulting Physician (General Surgery)  DIAGNOSIS:    ICD-10-CM   1. Malignant neoplasm of upper-outer quadrant of left breast in female, estrogen receptor positive ([place])  C50.412    Z17.0       SUMMARY OF ONCOLOGIC HISTORY: Oncology History  Malignant neoplasm of upper-outer quadrant of left breast in female, estrogen receptor positive ([place])  03/21/2020 Initial Diagnosis   Screening mammogram detected left breast calcifications. Diagnostic mammogram on 03/02/20 showed two 0.6cm groups of calcifications in the left breast, spanning 2.0cm in total. Biopsy on 03/21/20 showed invasive mammary carcinoma with lobular and ductal features, HER-2 negative (0), ER+ 40% weak, PR+ 40% moderate, Ki67 10%.   03/21/2020 Cancer Staging   Staging form: Breast, AJCC 8th Edition - Clinical stage from 03/21/2020: Stage IA (cT1b, cN0, cM0, G2, ER+, PR+, HER2-) - Signed by Gardenia Phlegm, NP on 10/18/2020 Stage prefix: Initial diagnosis Histologic grading system: 3 grade system    04/24/2020 Surgery   Left lumpectomy Marlou Starks): no residual invasive carcinoma, LCIS, and 3 left axillary lymph nodes negative for carcinoma.   04/24/2020 Cancer Staging   Staging form: Breast, AJCC 8th Edition - Pathologic stage from 04/24/2020: pT0, pN0, cM0 - Signed by Gardenia Phlegm, NP on 10/18/2020 Stage prefix: Initial diagnosis    05/11/2020 Genetic Testing   Negative genetic testing.  CDC73 VUS identified.  The CancerNext-Expanded gene panel offered by [facility] and includes sequencing and rearrangement analysis for the following 77 genes: AIP, ALK, APC*, ATM*, AXIN2, BAP1, BARD1, BLM, BMPR1A, BRCA1*, BRCA2*, BRIP1*, CDC73, CDH1*, CDK4, CDKN1B, CDKN2A, CHEK2*, CTNNA1, DICER1, FANCC, FH,  FLCN, GALNT12, KIF1B, LZTR1, MAX, MEN1, MET, MLH1*, MSH2*, MSH3, MSH6*, MUTYH*, NBN, NF1*, NF2, NTHL1, PALB2*, PHOX2B, PMS2*, POT1, PRKAR1A, PTCH1, PTEN*, RAD51C*, RAD51D*, RB1, RECQL, RET, SDHA, SDHAF2, SDHB, SDHC, SDHD, SMAD4, SMARCA4, SMARCB1, SMARCE1, STK11, SUFU, TMEM127, TP53*, TSC1, TSC2, VHL and XRCC2 (sequencing and deletion/duplication); EGFR, EGLN1, HOXB13, KIT, MITF, PDGFRA, POLD1, and POLE (sequencing only); EPCAM and GREM1 (deletion/duplication only). DNA and RNA analyses performed for * genes. The report date is May 11, 2020.   06/01/2020 - 06/29/2020 Radiation Therapy   Adjuvant radiation  06/01/2020 through 06/29/2020 Site Technique Total Dose (Gy) Dose per Fx (Gy) Completed Fx Beam Energies  Breast, Left: Breast_Lt 3D 42.56/42.56 2.66 16/16 6X  Breast, Left: Breast_Lt_Bst 3D 8/8 2 4/4 6X    07/16/2020 -  Anti-estrogen oral therapy   Anastrozole     CHIEF COMPLIANT: Follow-up of left breast cancer  INTERVAL HISTORY: Nicole Potter is a 61 y.o. with above-mentioned history of left breast cancer who underwent a left lumpectomy and radiation, currently on antiestrogen therapy with anastrozole. Mammogram on 02/07/2021 showed no evidence of malignancy. She presents to the clinic today for follow-up.   ALLERGIES:  has No Known Allergies.  MEDICATIONS:  Current Outpatient Medications  Medication Sig Dispense Refill   ALPRAZolam (XANAX) 0.5 MG tablet Take 0.5 mg by mouth daily as needed.     anastrozole (ARIMIDEX) 1 MG tablet Take 1 tablet (1 mg total) by mouth daily. 90 tablet 3   Multiple Vitamin (MULTIVITAMIN WITH MINERALS) TABS tablet Take 1 tablet by mouth daily.     NP THYROID 90 MG tablet Take 90 mg by mouth at bedtime.     phentermine 30 MG capsule Take  30 mg by mouth daily as needed (weightloss).     No current facility-administered medications for this visit.    PHYSICAL EXAMINATION: ECOG PERFORMANCE STATUS: 1 - Symptomatic but completely ambulatory  There were no  vitals filed for this visit. There were no vitals filed for this visit.  BREAST: No palpable masses or nodules in either right or left breasts. No palpable axillary supraclavicular or infraclavicular adenopathy no breast tenderness or nipple discharge. (exam performed in the presence of a chaperone)  LABORATORY DATA:  I have reviewed the data as listed CMP Latest Ref Rng & Units 07/05/2020  Glucose 70 - 99 mg/dL 98  BUN 6 - 20 mg/dL 21(H)  Creatinine 0.44 - 1.00 mg/dL 0.83  Sodium 135 - 145 mmol/L 142  Potassium 3.5 - 5.1 mmol/L 3.8  Chloride 98 - 111 mmol/L 105  CO2 22 - 32 mmol/L 29  Calcium 8.9 - 10.3 mg/dL 10.2  Total Protein 6.5 - 8.1 g/dL 8.5(H)  Total Bilirubin 0.3 - 1.2 mg/dL 0.8  Alkaline Phos 38 - 126 U/L 104  AST 15 - 41 U/L 35  ALT 0 - 44 U/L 26    Lab Results  Component Value Date   WBC 9.0 07/05/2020   HGB 15.0 07/05/2020   HCT 45.3 07/05/2020   MCV 91.7 07/05/2020   PLT 348 07/05/2020   NEUTROABS 7.4 07/05/2020    ASSESSMENT & PLAN:  Malignant neoplasm of upper-outer quadrant of left breast in female, estrogen receptor positive ([place]) 03/21/2020:Screening mammogram detected left breast calcifications. Diagnostic mammogram on 03/02/20 showed two 0.6cm groups of calcifications in the left breast, spanning 2.0cm in total. Biopsy on 03/21/20 showed invasive mammary carcinoma with lobular and ductal features, HER-2 negative (0), ER+ 40% weak, PR+ 40% moderate, Ki67 10%.   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 (Size 2 mm on biopsy)   Adjuvant radiation to be completed 06/29/2020   Anastrozole Toxicities: Tolerating anastrozole extremely well without any problems or concerns. Occasional hot flashes which have improved since she changed the time of the day. Denies any joint stiffness or achiness.  She lost 25 pounds by using weight loss medication Monjuro  Breast Cancer  Surveillance: 1. Breast Exam: 04/25/21: Benign 2. Mammogram: 02/07/21: Benign, Density B  RTC in 1 year   No orders of the defined types were placed in this encounter.  The patient has a good understanding of the overall plan. she agrees with it. she will call with any problems that may develop before the next visit here.  Total time spent: 20 mins including face to face time and time spent for planning, charting and coordination of care  Rulon Eisenmenger, MD, MPH 04/25/2021  I, Thana Ates, am acting as scribe for Dr. Nicholas Lose.  I have reviewed the above documentation for accuracy and completeness, and I agree with the above.

## 2021-04-25 ENCOUNTER — Inpatient Hospital Stay: Payer: BC Managed Care – PPO | Attending: Hematology and Oncology | Admitting: Hematology and Oncology

## 2021-04-25 ENCOUNTER — Other Ambulatory Visit: Payer: Self-pay

## 2021-04-25 DIAGNOSIS — Z170 Estrogen receptor positive status [ER+]: Secondary | ICD-10-CM | POA: Insufficient documentation

## 2021-04-25 DIAGNOSIS — C50412 Malignant neoplasm of upper-outer quadrant of left female breast: Secondary | ICD-10-CM | POA: Diagnosis not present

## 2021-04-25 DIAGNOSIS — Z923 Personal history of irradiation: Secondary | ICD-10-CM | POA: Insufficient documentation

## 2021-04-25 DIAGNOSIS — Z79811 Long term (current) use of aromatase inhibitors: Secondary | ICD-10-CM | POA: Insufficient documentation

## 2021-04-25 MED ORDER — ANASTROZOLE 1 MG PO TABS: 1.0000 mg | ORAL_TABLET | Freq: Every day | ORAL | 3 refills | Status: DC

## 2021-07-04 DIAGNOSIS — H6691 Otitis media, unspecified, right ear: Secondary | ICD-10-CM | POA: Diagnosis not present

## 2021-07-24 ENCOUNTER — Encounter (HOSPITAL_COMMUNITY): Payer: Self-pay

## 2021-12-26 ENCOUNTER — Other Ambulatory Visit: Payer: Self-pay | Admitting: Adult Health

## 2021-12-26 DIAGNOSIS — Z9889 Other specified postprocedural states: Secondary | ICD-10-CM

## 2022-01-26 IMAGING — MG MM PLC BREAST LOC DEV EA ADD LESION INC MAMMO GUIDE*L*
6 series · 6 of 6 positions shown · non-contrast
Comparison: Previous exam(s).

CLINICAL DATA: Pre lumpectomy localization of recently placed X
shaped and coil shaped biopsy marker clips in the upper outer left
breast at the sites of recently diagnosed invasive mammary carcinoma
with lobular and ductal features, mammary carcinoma in situ, flat
epithelial atypia and atypical lobular hyperplasia.

EXAM:
MAMMOGRAPHIC GUIDED RADIOACTIVE SEED LOCALIZATION OF THE LEFT BREAST
X 2

[L CC (1 of 3)]
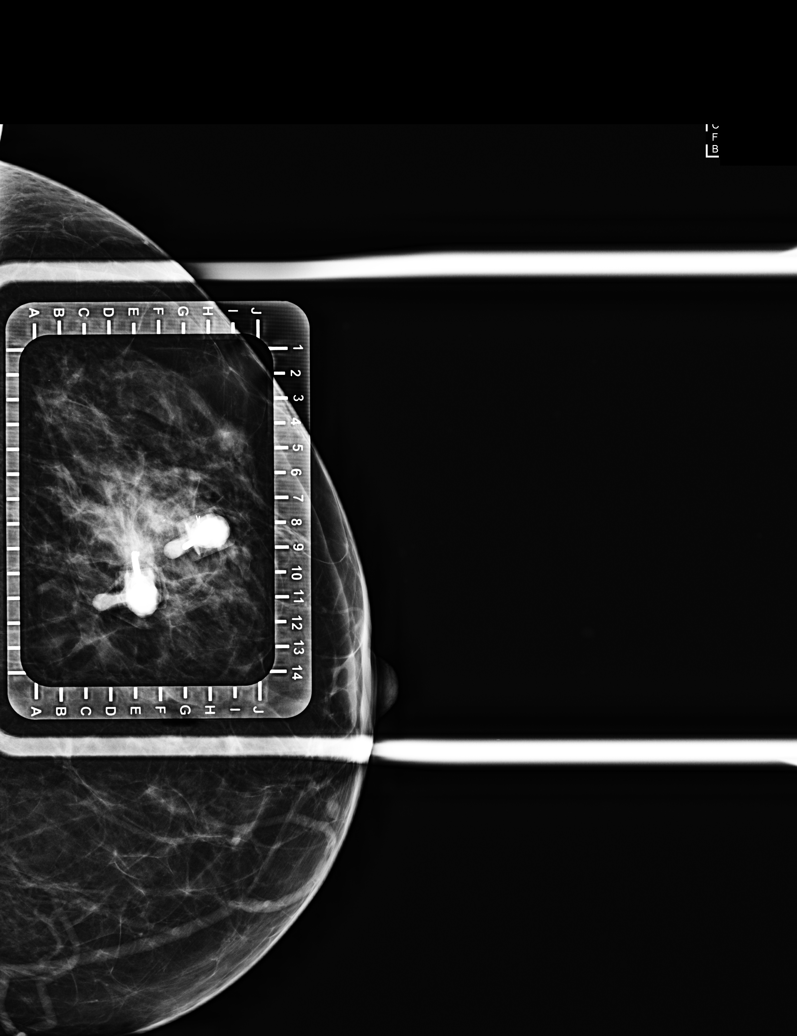

[L CC (2 of 3)]
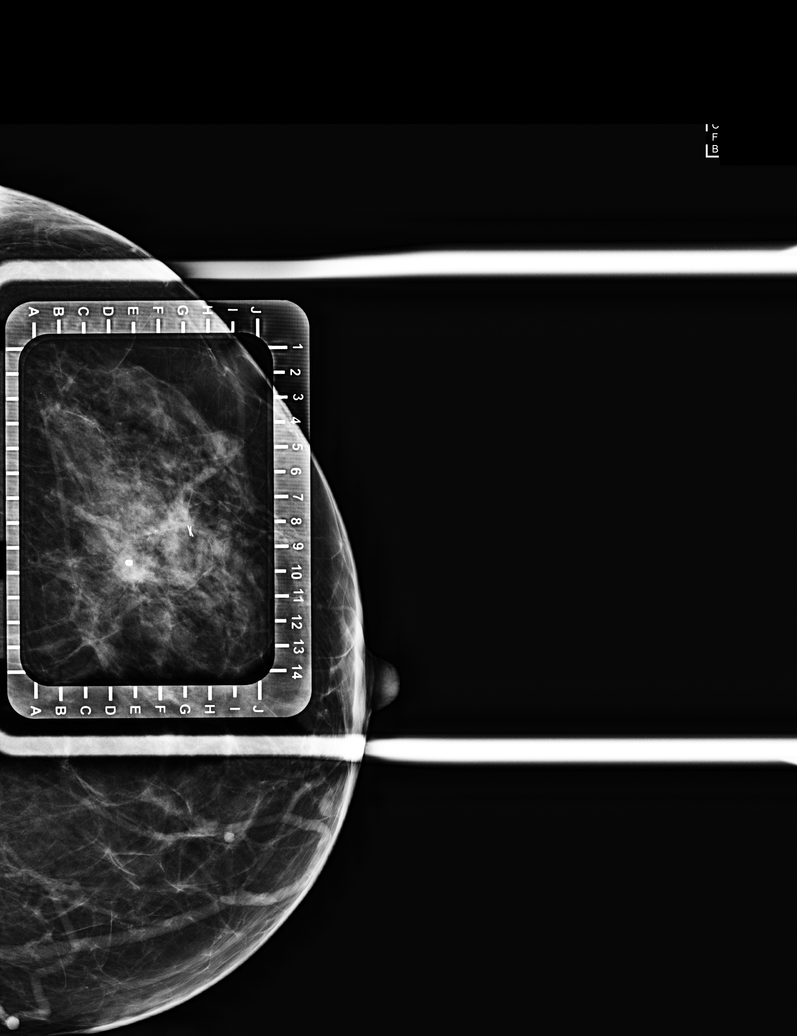

[L CC (3 of 3)]
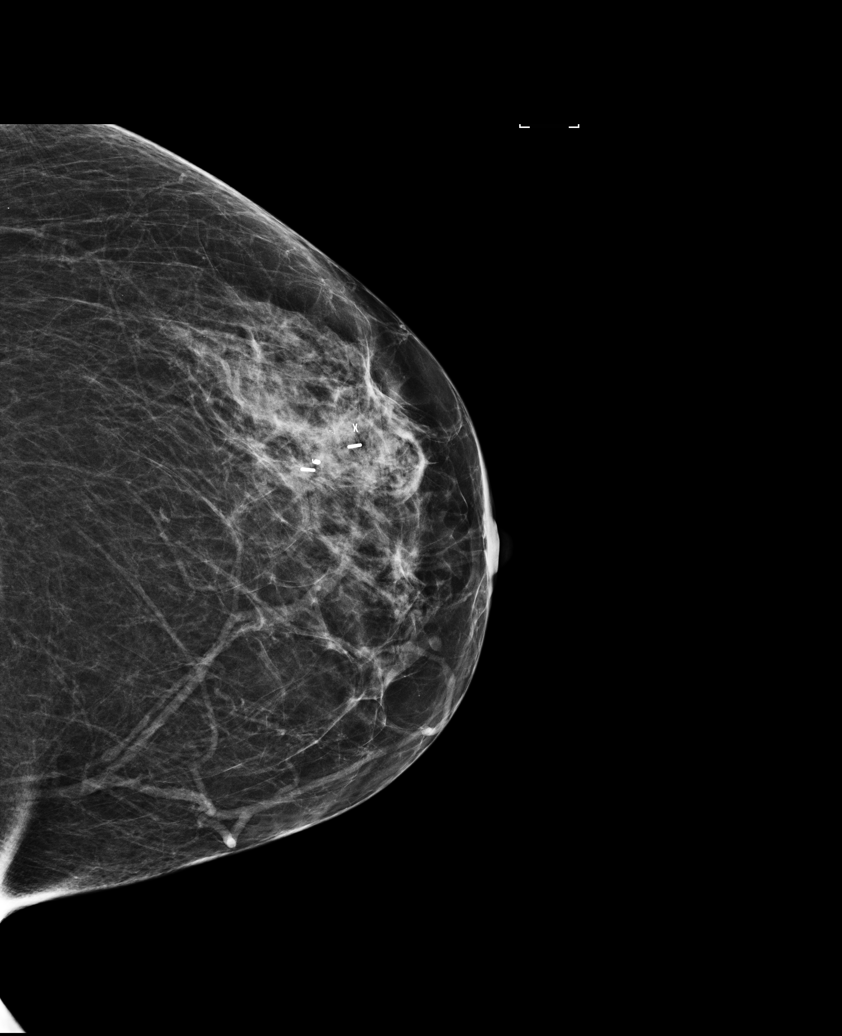

[L ML (1 of 3)]
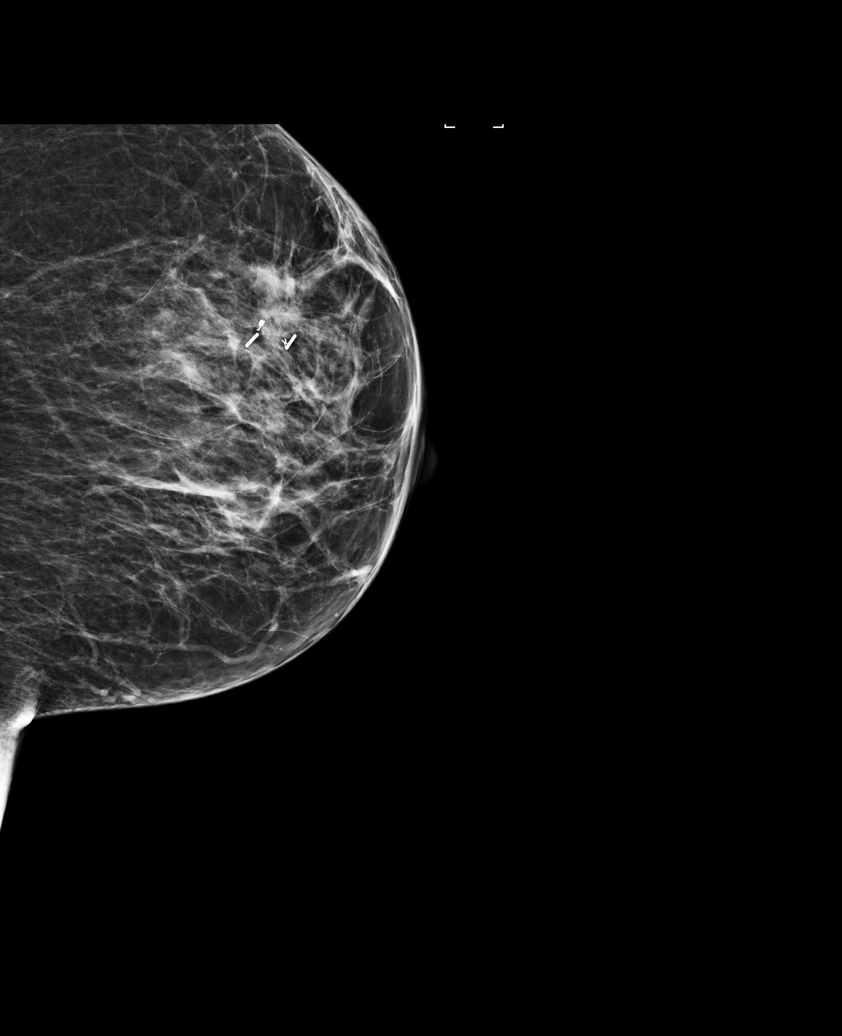

[L ML (2 of 3)]
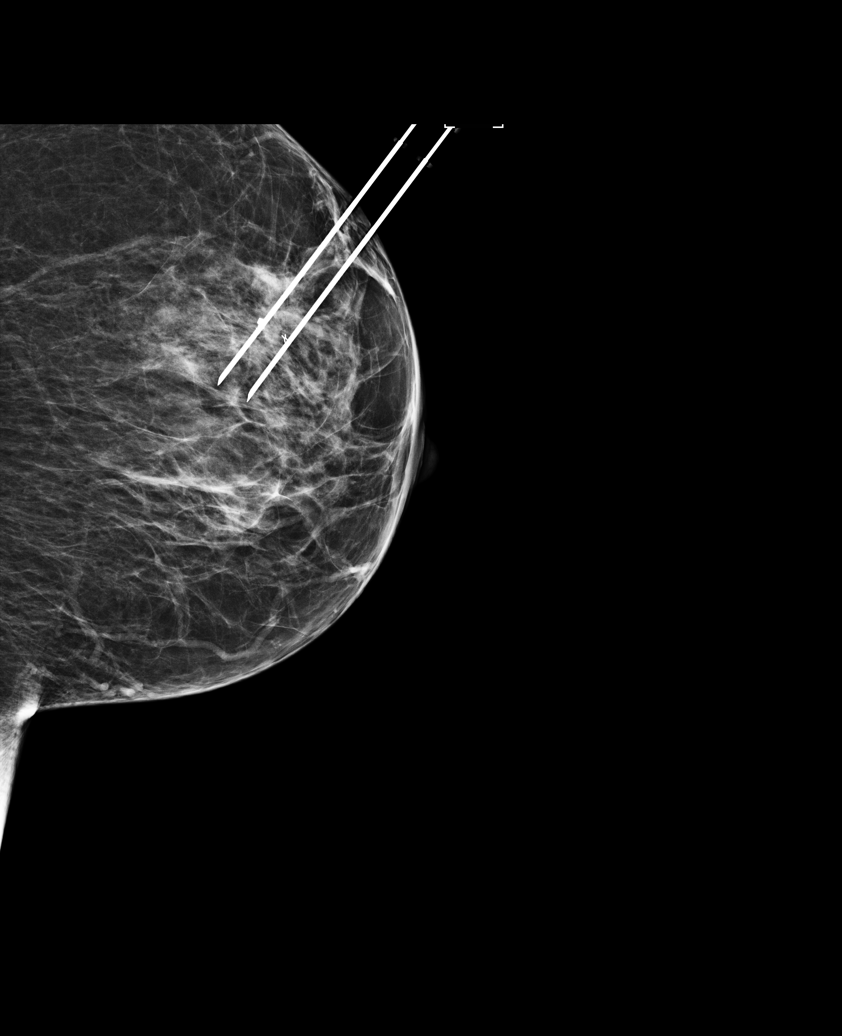

[L ML (3 of 3)]
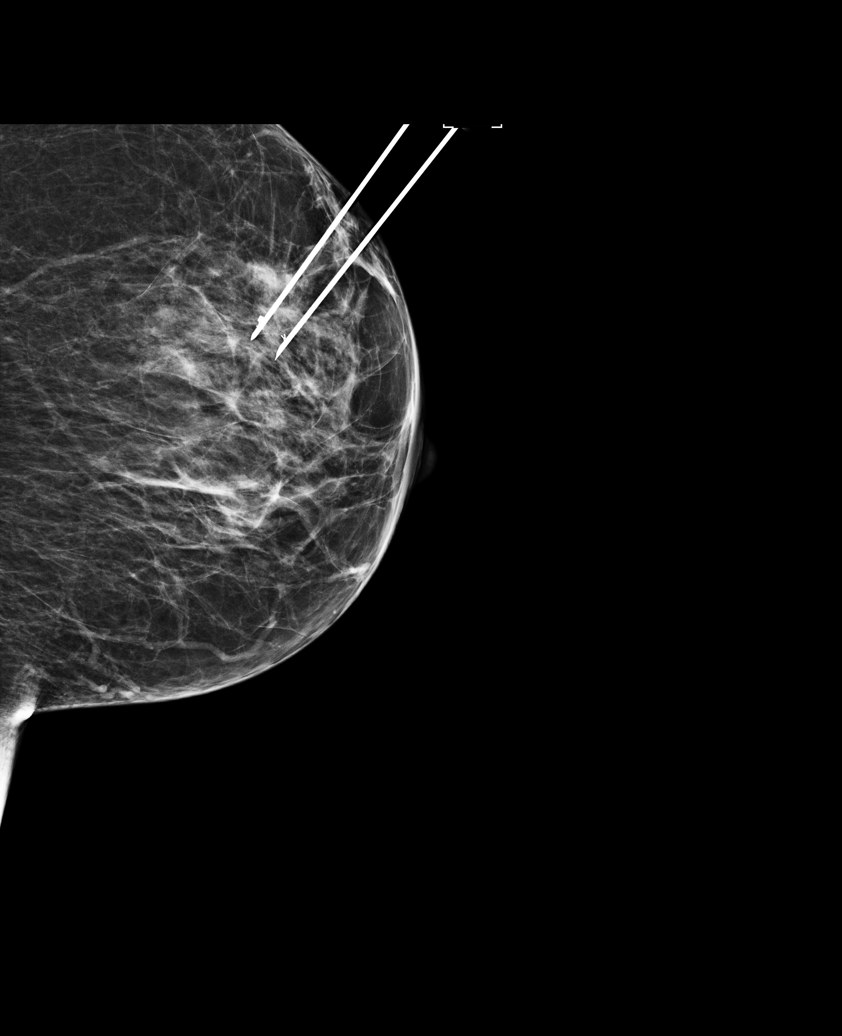

[6 of 6 positions shown; findings below may reference images not displayed]

FINDINGS: Patient presents for radioactive seed localization prior to left
lumpectomy. I met with the patient and we discussed the procedure of
seed localization including benefits and alternatives. We discussed
the high likelihood of successful procedures. We discussed the risks
of the procedures including infection, bleeding, tissue injury and
further surgery. We discussed the low dose of radioactivity involved
in the procedures. Informed, written consent was given.

The usual time-out protocol was performed immediately prior to the
procedures.

SITE 1: COIL SHAPED BIOPSY MARKER CLIP IN THE UPPER OUTER LEFT
BREAST

Using mammographic guidance, sterile technique, 1% lidocaine and an
5-M2X radioactive seed, the coil shaped biopsy marker clip in the
upper outer left breast was localized using a cephalad approach. The
follow-up mammogram images confirm the seed in the expected location
and were marked for Dr. Maeda.

Follow-up survey of the patient confirms presence of the radioactive
seed.

Order number of 5-M2X seed:  [id number].

Total activity:  0.256 mCi reference Date: 04/06/2020

SITE 2: X SHAPED BIOPSY MARKER CLIP IN THE UPPER OUTER LEFT BREAST

Using mammographic guidance, sterile technique, 1% lidocaine and an
5-M2X radioactive seed, the X shaped biopsy marker clip in the upper
outer left breast was localized using a cephalad approach. The
follow-up mammogram images confirm the seed in the expected location
and were marked for Dr. Maeda.

Follow-up survey of the patient confirms presence of the radioactive
seed.

Order number of 5-M2X seed:  [id number].

Total activity:  0.256 mCi reference Date: 04/06/2020

The patient tolerated the procedures well and was released from the
[REDACTED]. She was given instructions regarding seed removal.
IMPRESSION: Radioactive seed localization left breast X 2. No apparent
complications.

## 2022-02-16 DIAGNOSIS — R197 Diarrhea, unspecified: Secondary | ICD-10-CM | POA: Diagnosis not present

## 2022-02-16 DIAGNOSIS — R319 Hematuria, unspecified: Secondary | ICD-10-CM | POA: Diagnosis not present

## 2022-02-16 DIAGNOSIS — K591 Functional diarrhea: Secondary | ICD-10-CM | POA: Diagnosis not present

## 2022-02-19 DIAGNOSIS — D649 Anemia, unspecified: Secondary | ICD-10-CM | POA: Diagnosis not present

## 2022-02-19 DIAGNOSIS — R197 Diarrhea, unspecified: Secondary | ICD-10-CM | POA: Diagnosis not present

## 2022-02-22 DIAGNOSIS — R194 Change in bowel habit: Secondary | ICD-10-CM | POA: Diagnosis not present

## 2022-02-22 DIAGNOSIS — K529 Noninfective gastroenteritis and colitis, unspecified: Secondary | ICD-10-CM | POA: Diagnosis not present

## 2022-02-22 DIAGNOSIS — K921 Melena: Secondary | ICD-10-CM | POA: Diagnosis not present

## 2022-02-25 DIAGNOSIS — K529 Noninfective gastroenteritis and colitis, unspecified: Secondary | ICD-10-CM | POA: Diagnosis not present

## 2022-02-25 DIAGNOSIS — R194 Change in bowel habit: Secondary | ICD-10-CM | POA: Diagnosis not present

## 2022-03-13 DIAGNOSIS — K523 Indeterminate colitis: Secondary | ICD-10-CM | POA: Diagnosis not present

## 2022-03-13 DIAGNOSIS — R197 Diarrhea, unspecified: Secondary | ICD-10-CM | POA: Diagnosis not present

## 2022-03-13 DIAGNOSIS — K5289 Other specified noninfective gastroenteritis and colitis: Secondary | ICD-10-CM | POA: Diagnosis not present

## 2022-03-13 DIAGNOSIS — R194 Change in bowel habit: Secondary | ICD-10-CM | POA: Diagnosis not present

## 2022-03-13 DIAGNOSIS — K5730 Diverticulosis of large intestine without perforation or abscess without bleeding: Secondary | ICD-10-CM | POA: Diagnosis not present

## 2022-03-25 DIAGNOSIS — R748 Abnormal levels of other serum enzymes: Secondary | ICD-10-CM | POA: Diagnosis not present

## 2022-03-27 ENCOUNTER — Ambulatory Visit
Admission: RE | Admit: 2022-03-27 | Discharge: 2022-03-27 | Disposition: A | Discharge status: Home / Self Care | Payer: BC Managed Care – PPO | Source: Ambulatory Visit | Attending: Adult Health | Admitting: Adult Health

## 2022-03-27 DIAGNOSIS — Z9889 Other specified postprocedural states: Secondary | ICD-10-CM

## 2022-03-27 DIAGNOSIS — Z853 Personal history of malignant neoplasm of breast: Secondary | ICD-10-CM | POA: Diagnosis not present

## 2022-03-27 HISTORY — DX: Personal history of irradiation: Z92.3

## 2022-04-03 DIAGNOSIS — K5100 Ulcerative (chronic) pancolitis without complications: Secondary | ICD-10-CM | POA: Diagnosis not present

## 2022-04-10 ENCOUNTER — Telehealth: Payer: Self-pay | Admitting: Hematology and Oncology

## 2022-04-10 NOTE — Telephone Encounter (Signed)
Rescheduled appointment per provider PAL. Patient is aware of the changes made to her upcoming appointment. 

## 2022-04-17 DIAGNOSIS — Z01419 Encounter for gynecological examination (general) (routine) without abnormal findings: Secondary | ICD-10-CM | POA: Diagnosis not present

## 2022-04-17 DIAGNOSIS — Z6827 Body mass index (BMI) 27.0-27.9, adult: Secondary | ICD-10-CM | POA: Diagnosis not present

## 2022-04-25 ENCOUNTER — Ambulatory Visit: Payer: BC Managed Care – PPO | Admitting: Hematology and Oncology

## 2022-05-02 NOTE — Assessment & Plan Note (Addendum)
03/21/2020:Screening mammogram detected left breast calcifications. Diagnostic mammogram on 03/02/20 showed two 0.6cm groups of calcifications in the left breast, spanning 2.0cm in total. Biopsy on 03/21/20 showed invasive mammary carcinoma with lobular and ductal features, HER-2 negative (0), ER+ 40% weak, PR+ 40% moderate, Ki67 10%.   04/24/20: Left lumpectomy Nicole Potter): no residual invasive carcinoma, LCIS, and 3 left axillary lymph nodes negative for carcinoma. (Size 2 mm on biopsy)   Adjuvant radiation to be completed 06/29/2020   Anastrozole Toxicities: Tolerating anastrozole extremely well without any problems or concerns.   Denies any joint stiffness or achiness.   She lost 35 pounds by using weight loss medication Monjuro   Breast Cancer Surveillance: 1. Breast Exam: 05/02/2022: Benign 2. Mammogram: 03/29/2022: Benign, Density B   She was diagnosed with ulcerative colitis last year and she is currently on Humira.  RTC in 1 year

## 2022-05-02 NOTE — Progress Notes (Signed)
Patient Care Team: Patient, No Pcp Per as PCP - General (General Practice) Nicholas Lose, MD as Consulting Physician (Hematology and Oncology) Kyung Rudd, MD as Consulting Physician (Radiation Oncology) Jovita Kussmaul, MD as Consulting Physician (General Surgery)  DIAGNOSIS:  Encounter Diagnosis  Name Primary?   Malignant neoplasm of upper-outer quadrant of left breast in female, estrogen receptor positive ([place]) Yes    SUMMARY OF ONCOLOGIC HISTORY: Oncology History  Malignant neoplasm of upper-outer quadrant of left breast in female, estrogen receptor positive ([place])  03/21/2020 Initial Diagnosis   Screening mammogram detected left breast calcifications. Diagnostic mammogram on 03/02/20 showed two 0.6cm groups of calcifications in the left breast, spanning 2.0cm in total. Biopsy on 03/21/20 showed invasive mammary carcinoma with lobular and ductal features, HER-2 negative (0), ER+ 40% weak, PR+ 40% moderate, Ki67 10%.   03/21/2020 Cancer Staging   Staging form: Breast, AJCC 8th Edition - Clinical stage from 03/21/2020: Stage IA (cT1b, cN0, cM0, G2, ER+, PR+, HER2-) - Signed by Gardenia Phlegm, NP on 10/18/2020 Stage prefix: Initial diagnosis Histologic grading system: 3 grade system   04/24/2020 Surgery   Left lumpectomy Marlou Starks): no residual invasive carcinoma, LCIS, and 3 left axillary lymph nodes negative for carcinoma.   04/24/2020 Cancer Staging   Staging form: Breast, AJCC 8th Edition - Pathologic stage from 04/24/2020: pT0, pN0, cM0 - Signed by Gardenia Phlegm, NP on 10/18/2020 Stage prefix: Initial diagnosis   05/11/2020 Genetic Testing   Negative genetic testing.  CDC73 VUS identified.  The CancerNext-Expanded gene panel offered by [facility] and includes sequencing and rearrangement analysis for the following 77 genes: AIP, ALK, APC*, ATM*, AXIN2, BAP1, BARD1, BLM, BMPR1A, BRCA1*, BRCA2*, BRIP1*, CDC73, CDH1*, CDK4, CDKN1B, CDKN2A, CHEK2*, CTNNA1, DICER1, FANCC, FH,  FLCN, GALNT12, KIF1B, LZTR1, MAX, MEN1, MET, MLH1*, MSH2*, MSH3, MSH6*, MUTYH*, NBN, NF1*, NF2, NTHL1, PALB2*, PHOX2B, PMS2*, POT1, PRKAR1A, PTCH1, PTEN*, RAD51C*, RAD51D*, RB1, RECQL, RET, SDHA, SDHAF2, SDHB, SDHC, SDHD, SMAD4, SMARCA4, SMARCB1, SMARCE1, STK11, SUFU, TMEM127, TP53*, TSC1, TSC2, VHL and XRCC2 (sequencing and deletion/duplication); EGFR, EGLN1, HOXB13, KIT, MITF, PDGFRA, POLD1, and POLE (sequencing only); EPCAM and GREM1 (deletion/duplication only). DNA and RNA analyses performed for * genes. The report date is May 11, 2020.   06/01/2020 - 06/29/2020 Radiation Therapy   Adjuvant radiation  06/01/2020 through 06/29/2020 Site Technique Total Dose (Gy) Dose per Fx (Gy) Completed Fx Beam Energies  Breast, Left: Breast_Lt 3D 42.56/42.56 2.66 16/16 6X  Breast, Left: Breast_Lt_Bst 3D 8/8 2 4/4 6X    07/16/2020 -  Anti-estrogen oral therapy   Anastrozole     CHIEF COMPLIANT: Follow-up of left breast cancer   INTERVAL HISTORY: Nicole Potter is a 62 y.o. with above-mentioned history of left breast cancer who underwent a left lumpectomy and radiation, currently on antiestrogen therapy with anastrozole.  She presents to the clinic for a follow-up. She reports that she has loss at least 30-40 pounds on her weight loss journey. She is tolerating the anastrozole extremely well. She has mild hot flashes. Denies any joint stiffness. Denies any pain or discomfort in breast.    ALLERGIES:  has No Known Allergies.  MEDICATIONS:  Current Outpatient Medications  Medication Sig Dispense Refill   Adalimumab (HUMIRA, 2 PEN,) 40 MG/0.4ML PNKT as directed Subcutaneous subcutaneous every other week for 30 days     ALPRAZolam (XANAX) 0.5 MG tablet Take 0.5 mg by mouth daily as needed.     MOUNJARO 15 MG/0.5ML Pen Inject into the skin.  Multiple Vitamin (MULTIVITAMIN WITH MINERALS) TABS tablet Take 1 tablet by mouth daily.     NP THYROID 90 MG tablet Take 90 mg by mouth at bedtime.     phentermine 30  MG capsule Take 30 mg by mouth daily as needed (weightloss).     predniSONE (DELTASONE) 10 MG tablet Take 10 mg by mouth daily.     anastrozole (ARIMIDEX) 1 MG tablet Take 1 tablet (1 mg total) by mouth daily. 90 tablet 3   No current facility-administered medications for this visit.    PHYSICAL EXAMINATION: ECOG PERFORMANCE STATUS: 1 - Symptomatic but completely ambulatory  Vitals:   05/03/22 0814  BP: 126/71  Pulse: 72  Resp: 14  Temp: (!) 97.5 F (36.4 C)  SpO2: 100%   Filed Weights   05/03/22 0814  Weight: 178 lb 4.8 oz (80.9 kg)    BREAST: No palpable masses or nodules in either right or left breasts. No palpable axillary supraclavicular or infraclavicular adenopathy no breast tenderness or nipple discharge. (exam performed in the presence of a chaperone)  LABORATORY DATA:  I have reviewed the data as listed    Latest Ref Rng & Units 07/05/2020    7:29 PM  CMP  Glucose 70 - 99 mg/dL 98   BUN 6 - 20 mg/dL 21   Creatinine 0.44 - 1.00 mg/dL 0.83   Sodium 135 - 145 mmol/L 142   Potassium 3.5 - 5.1 mmol/L 3.8   Chloride 98 - 111 mmol/L 105   CO2 22 - 32 mmol/L 29   Calcium 8.9 - 10.3 mg/dL 10.2   Total Protein 6.5 - 8.1 g/dL 8.5   Total Bilirubin 0.3 - 1.2 mg/dL 0.8   Alkaline Phos 38 - 126 U/L 104   AST 15 - 41 U/L 35   ALT 0 - 44 U/L 26     Lab Results  Component Value Date   WBC 9.0 07/05/2020   HGB 15.0 07/05/2020   HCT 45.3 07/05/2020   MCV 91.7 07/05/2020   PLT 348 07/05/2020   NEUTROABS 7.4 07/05/2020    ASSESSMENT & PLAN:  Malignant neoplasm of upper-outer quadrant of left breast in female, estrogen receptor positive ([place]) 03/21/2020:Screening mammogram detected left breast calcifications. Diagnostic mammogram on 03/02/20 showed two 0.6cm groups of calcifications in the left breast, spanning 2.0cm in total. Biopsy on 03/21/20 showed invasive mammary carcinoma with lobular and ductal features, HER-2 negative (0), ER+ 40% weak, PR+ 40% moderate, Ki67 10%.    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 (Size 2 mm on biopsy)   Adjuvant radiation to be completed 06/29/2020   Anastrozole Toxicities: Tolerating anastrozole extremely well without any problems or concerns.   Denies any joint stiffness or achiness.   She lost 35 pounds by using weight loss medication Monjuro   Breast Cancer Surveillance: 1. Breast Exam: 05/02/2022: Benign 2. Mammogram: 03/29/2022: Benign, Density B   She was diagnosed with ulcerative colitis last year and she is currently on Humira.  RTC in 1 year     No orders of the defined types were placed in this encounter.  The patient has a good understanding of the overall plan. she agrees with it. she will call with any problems that may develop before the next visit here. Total time spent: 30 mins including face to face time and time spent for planning, charting and co-ordination of care   Harriette Ohara, MD 05/03/22  I Gardiner Coins am acting as a Education administrator for [employer]  I have reviewed the above documentation for accuracy and completeness, and I agree with the above.

## 2022-05-03 ENCOUNTER — Inpatient Hospital Stay: Payer: BC Managed Care – PPO | Attending: Hematology and Oncology | Admitting: Hematology and Oncology

## 2022-05-03 VITALS — BP 126/71 | HR 72 | Temp 97.5°F | Resp 14 | Ht 69.0 in | Wt 178.3 lb

## 2022-05-03 DIAGNOSIS — Z170 Estrogen receptor positive status [ER+]: Secondary | ICD-10-CM | POA: Insufficient documentation

## 2022-05-03 DIAGNOSIS — C50412 Malignant neoplasm of upper-outer quadrant of left female breast: Secondary | ICD-10-CM | POA: Insufficient documentation

## 2022-05-03 DIAGNOSIS — Z923 Personal history of irradiation: Secondary | ICD-10-CM | POA: Diagnosis not present

## 2022-05-03 DIAGNOSIS — Z79811 Long term (current) use of aromatase inhibitors: Secondary | ICD-10-CM | POA: Insufficient documentation

## 2022-05-03 MED ORDER — ANASTROZOLE 1 MG PO TABS: 1.0000 mg | ORAL_TABLET | Freq: Every day | ORAL | 3 refills | Status: DC

## 2022-06-21 DIAGNOSIS — K529 Noninfective gastroenteritis and colitis, unspecified: Secondary | ICD-10-CM | POA: Diagnosis not present

## 2022-06-24 DIAGNOSIS — K529 Noninfective gastroenteritis and colitis, unspecified: Secondary | ICD-10-CM | POA: Diagnosis not present

## 2022-08-20 DIAGNOSIS — K529 Noninfective gastroenteritis and colitis, unspecified: Secondary | ICD-10-CM | POA: Diagnosis not present

## 2022-08-29 ENCOUNTER — Inpatient Hospital Stay: Payer: BC Managed Care – PPO | Attending: Physician Assistant | Admitting: Physician Assistant

## 2022-08-29 ENCOUNTER — Other Ambulatory Visit: Payer: Self-pay

## 2022-08-29 ENCOUNTER — Telehealth: Payer: Self-pay | Admitting: *Deleted

## 2022-08-29 VITALS — BP 129/67 | HR 76 | Temp 98.5°F | Resp 16 | Wt 178.5 lb

## 2022-08-29 DIAGNOSIS — Z79899 Other long term (current) drug therapy: Secondary | ICD-10-CM | POA: Diagnosis not present

## 2022-08-29 DIAGNOSIS — Z7952 Long term (current) use of systemic steroids: Secondary | ICD-10-CM | POA: Insufficient documentation

## 2022-08-29 DIAGNOSIS — Z801 Family history of malignant neoplasm of trachea, bronchus and lung: Secondary | ICD-10-CM | POA: Diagnosis not present

## 2022-08-29 DIAGNOSIS — Z170 Estrogen receptor positive status [ER+]: Secondary | ICD-10-CM | POA: Diagnosis not present

## 2022-08-29 DIAGNOSIS — Z79811 Long term (current) use of aromatase inhibitors: Secondary | ICD-10-CM | POA: Insufficient documentation

## 2022-08-29 DIAGNOSIS — L539 Erythematous condition, unspecified: Secondary | ICD-10-CM | POA: Diagnosis not present

## 2022-08-29 DIAGNOSIS — C50412 Malignant neoplasm of upper-outer quadrant of left female breast: Secondary | ICD-10-CM | POA: Diagnosis not present

## 2022-08-29 DIAGNOSIS — Z8042 Family history of malignant neoplasm of prostate: Secondary | ICD-10-CM | POA: Diagnosis not present

## 2022-08-29 DIAGNOSIS — Z808 Family history of malignant neoplasm of other organs or systems: Secondary | ICD-10-CM | POA: Insufficient documentation

## 2022-08-29 DIAGNOSIS — Z923 Personal history of irradiation: Secondary | ICD-10-CM | POA: Diagnosis not present

## 2022-08-29 MED ORDER — CEPHALEXIN 500 MG PO CAPS: ORAL_CAPSULE | ORAL | 0 refills | Status: AC

## 2022-08-29 NOTE — Progress Notes (Signed)
Symptom Management Consult Note [facility]    Patient Care Team: Patient, No Pcp Per as PCP - General (General Practice) Serena Croissant, MD as Consulting Physician (Hematology and Oncology) Dorothy Puffer, MD as Consulting Physician (Radiation Oncology) Griselda Miner, MD as Consulting Physician (General Surgery)    Name / MRN / DOBIsraa Potter  [id number]  [birth year]   Date of visit: 08/29/2022   Chief Complaint/Reason for visit: redness of left breast   Current Therapy: Anastrozole   ASSESSMENT & PLAN: Patient is a 62 y.o. female  with oncologic history of left breast cancer, ER positive followed by Dr. Pamelia Hoit.  I have viewed most recent oncology note and lab work.    #Left breast cancer, ER positive -s/p left lumpectomy and radiation in 2022 - Next appointment with oncologist is 05/06/23   #Breast erythema -New. Patient non toxic appearing. No consitutional symptoms  -Given rapid onset and history of radiation therapy will treat for possible cellulitis with keflex taper. -Will have patient RTC in 1 week for recheck. Radiation recall dermatitis also in DDX.  -Advised patient to reach out to GI before Avsola infusion to see if they recommend delaying with concern for infection.   Strict ED precautions discussed should symptoms worsen.    Heme/Onc History: Oncology History  Malignant neoplasm of upper-outer quadrant of left breast in female, estrogen receptor positive (HCC)  03/21/2020 Initial Diagnosis   Screening mammogram detected left breast calcifications. Diagnostic mammogram on 03/02/20 showed two 0.6cm groups of calcifications in the left breast, spanning 2.0cm in total. Biopsy on 03/21/20 showed invasive mammary carcinoma with lobular and ductal features, HER-2 negative (0), ER+ 40% weak, PR+ 40% moderate, Ki67 10%.   03/21/2020 Cancer Staging   Staging form: Breast, AJCC 8th Edition - Clinical stage from 03/21/2020: Stage IA (cT1b, cN0, cM0, G2, ER+,  PR+, HER2-) - Signed by Loa Socks, NP on 10/18/2020 Stage prefix: Initial diagnosis Histologic grading system: 3 grade system   04/24/2020 Surgery   Left lumpectomy Carolynne Edouard): no residual invasive carcinoma, LCIS, and 3 left axillary lymph nodes negative for carcinoma.   04/24/2020 Cancer Staging   Staging form: Breast, AJCC 8th Edition - Pathologic stage from 04/24/2020: pT0, pN0, cM0 - Signed by Loa Socks, NP on 10/18/2020 Stage prefix: Initial diagnosis   05/11/2020 Genetic Testing   Negative genetic testing.  CDC73 VUS identified.  The CancerNext-Expanded gene panel offered by [facility] and includes sequencing and rearrangement analysis for the following 77 genes: AIP, ALK, APC*, ATM*, AXIN2, BAP1, BARD1, BLM, BMPR1A, BRCA1*, BRCA2*, BRIP1*, CDC73, CDH1*, CDK4, CDKN1B, CDKN2A, CHEK2*, CTNNA1, DICER1, FANCC, FH, FLCN, GALNT12, KIF1B, LZTR1, MAX, MEN1, MET, MLH1*, MSH2*, MSH3, MSH6*, MUTYH*, NBN, NF1*, NF2, NTHL1, PALB2*, PHOX2B, PMS2*, POT1, PRKAR1A, PTCH1, PTEN*, RAD51C*, RAD51D*, RB1, RECQL, RET, SDHA, SDHAF2, SDHB, SDHC, SDHD, SMAD4, SMARCA4, SMARCB1, SMARCE1, STK11, SUFU, TMEM127, TP53*, TSC1, TSC2, VHL and XRCC2 (sequencing and deletion/duplication); EGFR, EGLN1, HOXB13, KIT, MITF, PDGFRA, POLD1, and POLE (sequencing only); EPCAM and GREM1 (deletion/duplication only). DNA and RNA analyses performed for * genes. The report date is May 11, 2020.   06/01/2020 - 06/29/2020 Radiation Therapy   Adjuvant radiation  06/01/2020 through 06/29/2020 Site Technique Total Dose (Gy) Dose per Fx (Gy) Completed Fx Beam Energies  Breast, Left: Breast_Lt 3D 42.56/42.56 2.66 16/16 6X  Breast, Left: Breast_Lt_Bst 3D 8/8 2 4/4 6X    07/16/2020 -  Anti-estrogen oral therapy   Anastrozole       Interval  history-: Nicole Potter is a 62 y.o. female with oncologic history as above presenting to [facility] today with chief complaint of left breast redness x 1 day. Patient reports unaccompanied to  clinic.   Patient reports when she woke up this morning her left breast was red and warm to the touch with mild localized tenderness.  She has not had fever, chills, or fatigue. She denies history of similar symptoms. She did not take any OTC medications prior to arrival. She denies any injury or trauma. Patient also reports that she started a new treatment for her UC x 9 days ago, Avsola infusion. She tolerated infusion well with no major side effects. She is currently on a prednisone taper from GI as well- started 20 mg daily x 1 week ago which will be followed by 10 mg daily x 1 week.       ROS  All other systems are reviewed and are negative for acute change except as noted in the HPI.    No Known Allergies   Past Medical History:  Diagnosis Date   Anxiety    Breast cancer (HCC) 03/21/2020   left breast INVASIVE MAMMARY CA   Family history of adverse reaction to anesthesia    mother is sensitive to anesthesia   Family history of prostate cancer    Family history of thyroid cancer    Hypothyroidism    Personal history of radiation therapy      Past Surgical History:  Procedure Laterality Date   BREAST BIOPSY Left 03/21/2020   BREAST LUMPECTOMY Left 04/24/2020   BREAST LUMPECTOMY WITH RADIOACTIVE SEED AND SENTINEL LYMPH NODE BIOPSY Left 04/24/2020   Procedure: LEFT BREAST LUMPECTOMY  WITH RADIOACTIVE SEEDS X 2  AND LEFT AXILLARY SENTINEL LYMPH NODE BIOPSY X 3;  Surgeon: Griselda Miner, MD;  Location: [facility];  Service: General;  Laterality: Left;   KNEE SURGERY Left 1980   TUBAL LIGATION  2004    Social History   Socioeconomic History   Marital status: Unknown    Spouse name: Not on file   Number of children: Not on file   Years of education: Not on file   Highest education level: Not on file  Occupational History   Not on file  Tobacco Use   Smoking status: Never   Smokeless tobacco: Never  Vaping Use   Vaping Use: Never used  Substance and  Sexual Activity   Alcohol use: Yes    Comment: social   Drug use: Never   Sexual activity: Not on file  Other Topics Concern   Not on file  Social History Narrative   Not on file   Social Determinants of Health   Financial Resource Strain: Not on file  Food Insecurity: Not on file  Transportation Needs: Not on file  Physical Activity: Not on file  Stress: Not on file  Social Connections: Not on file  Intimate Partner Violence: Not At Risk (05/23/2020)   Humiliation, Afraid, Rape, and Kick questionnaire    Fear of Current or Ex-Partner: No    Emotionally Abused: No    Physically Abused: No    Sexually Abused: No    Family History  Problem Relation Age of Onset   Thyroid cancer Mother    Prostate cancer Father    Congestive Heart Failure Father    Skin cancer Father    Thyroid cancer Maternal Aunt 60   Throat cancer Paternal Grandfather    Cancer Cousin  unknown cancer - mat first cousin     Current Outpatient Medications:    cephALEXin (KEFLEX) 500 MG capsule, Take 2 capsules (1,000 mg total) by mouth every 6 (six) hours for 2 days, THEN 1 capsule (500 mg total) every 6 (six) hours for 8 days., Disp: 48 capsule, Rfl: 0   inFLIXimab-axxq (AVSOLA IV), , Disp: , Rfl:    Adalimumab (HUMIRA, 2 PEN,) 40 MG/0.4ML PNKT, as directed Subcutaneous subcutaneous every other week for 30 days, Disp: , Rfl:    ALPRAZolam (XANAX) 0.5 MG tablet, Take 0.5 mg by mouth daily as needed., Disp: , Rfl:    anastrozole (ARIMIDEX) 1 MG tablet, Take 1 tablet (1 mg total) by mouth daily., Disp: 90 tablet, Rfl: 3   MOUNJARO 15 MG/0.5ML Pen, Inject into the skin., Disp: , Rfl:    Multiple Vitamin (MULTIVITAMIN WITH MINERALS) TABS tablet, Take 1 tablet by mouth daily., Disp: , Rfl:    NP THYROID 90 MG tablet, Take 90 mg by mouth at bedtime., Disp: , Rfl:    phentermine 30 MG capsule, Take 30 mg by mouth daily as needed (weightloss)., Disp: , Rfl:    predniSONE (DELTASONE) 10 MG tablet, Take 10 mg  by mouth daily., Disp: , Rfl:   PHYSICAL EXAM: ECOG FS:1 - Symptomatic but completely ambulatory    Vitals:   08/29/22 1517  BP: 129/67  Pulse: 76  Resp: 16  Temp: 98.5 F (36.9 C)  TempSrc: Oral  SpO2: 99%  Weight: 178 lb 8 oz (81 kg)   Physical Exam Vitals and nursing note reviewed.  Constitutional:      Appearance: She is not ill-appearing or toxic-appearing.  HENT:     Head: Normocephalic.  Eyes:     Conjunctiva/sclera: Conjunctivae normal.  Cardiovascular:     Rate and Rhythm: Normal rate and regular rhythm.     Pulses: Normal pulses.     Heart sounds: Normal heart sounds.  Pulmonary:     Effort: Pulmonary effort is normal.     Breath sounds: Normal breath sounds.  Chest:     Comments: Left breast erythematous and warm. No palpable masses or gross abscess. Please see media below. Abdominal:     General: There is no distension.  Musculoskeletal:     Cervical back: Normal range of motion.  Skin:    General: Skin is warm and dry.  Neurological:     Mental Status: She is alert.        LABORATORY DATA: I have reviewed the data as listed    Latest Ref Rng & Units 07/05/2020    7:29 PM  CBC  WBC 4.0 - 10.5 K/uL 9.0   Hemoglobin 12.0 - 15.0 g/dL 16.1   Hematocrit 09.6 - 46.0 % 45.3   Platelets 150 - 400 K/uL 348         Latest Ref Rng & Units 07/05/2020    7:29 PM  CMP  Glucose 70 - 99 mg/dL 98   BUN 6 - 20 mg/dL 21   Creatinine 0.45 - 1.00 mg/dL 4.09   Sodium 811 - 914 mmol/L 142   Potassium 3.5 - 5.1 mmol/L 3.8   Chloride 98 - 111 mmol/L 105   CO2 22 - 32 mmol/L 29   Calcium 8.9 - 10.3 mg/dL 78.2   Total Protein 6.5 - 8.1 g/dL 8.5   Total Bilirubin 0.3 - 1.2 mg/dL 0.8   Alkaline Phos 38 - 126 U/L 104   AST 15 - 41 U/L 35  ALT 0 - 44 U/L 26        RADIOGRAPHIC STUDIES (from last 24 hours if applicable) I have personally reviewed the radiological images as listed and agreed with the findings in the report. No results found.       Visit Diagnosis: 1. Malignant neoplasm of upper-outer quadrant of left breast in female, estrogen receptor positive (HCC)   2. Breast erythema      No orders of the defined types were placed in this encounter.   All questions were answered. The patient knows to call the clinic with any problems, questions or concerns. No barriers to learning was detected.  A total of more than 30 minutes were spent on this encounter with face-to-face time and non-face-to-face time, including preparing to see the patient, ordering tests medications, counseling the patient and coordination of care as outlined above.    Thank you for allowing me to participate in the care of this patient.    Shanon Ace, PA-C Department of Hematology/Oncology [facility] at [facility] Phone: [phone]  [phone]    08/29/2022 10:10 PM

## 2022-08-29 NOTE — Telephone Encounter (Signed)
Received call from pt with complaint of left breast warmth and redness x1 day.  Pt states when she went to bed last night, her breast was WNL and woke up with it red and warm to the touch.  Pt denies any recent injury or trauma.  [facility] visit scheduled, pt verbalized understanding of appt details.

## 2022-09-03 DIAGNOSIS — K529 Noninfective gastroenteritis and colitis, unspecified: Secondary | ICD-10-CM | POA: Diagnosis not present

## 2022-09-06 ENCOUNTER — Other Ambulatory Visit: Payer: Self-pay

## 2022-09-06 ENCOUNTER — Inpatient Hospital Stay (HOSPITAL_BASED_OUTPATIENT_CLINIC_OR_DEPARTMENT_OTHER): Payer: BC Managed Care – PPO | Admitting: Physician Assistant

## 2022-09-06 VITALS — BP 121/62 | HR 76 | Temp 97.8°F | Resp 17 | Ht 69.0 in | Wt 178.1 lb

## 2022-09-06 DIAGNOSIS — Z7952 Long term (current) use of systemic steroids: Secondary | ICD-10-CM | POA: Diagnosis not present

## 2022-09-06 DIAGNOSIS — L539 Erythematous condition, unspecified: Secondary | ICD-10-CM

## 2022-09-06 DIAGNOSIS — Z170 Estrogen receptor positive status [ER+]: Secondary | ICD-10-CM

## 2022-09-06 DIAGNOSIS — C50412 Malignant neoplasm of upper-outer quadrant of left female breast: Secondary | ICD-10-CM | POA: Diagnosis not present

## 2022-09-06 DIAGNOSIS — Z79899 Other long term (current) drug therapy: Secondary | ICD-10-CM | POA: Diagnosis not present

## 2022-09-06 DIAGNOSIS — Z923 Personal history of irradiation: Secondary | ICD-10-CM | POA: Diagnosis not present

## 2022-09-06 DIAGNOSIS — Z8042 Family history of malignant neoplasm of prostate: Secondary | ICD-10-CM | POA: Diagnosis not present

## 2022-09-06 DIAGNOSIS — Z79811 Long term (current) use of aromatase inhibitors: Secondary | ICD-10-CM | POA: Diagnosis not present

## 2022-09-06 DIAGNOSIS — Z801 Family history of malignant neoplasm of trachea, bronchus and lung: Secondary | ICD-10-CM | POA: Diagnosis not present

## 2022-09-06 DIAGNOSIS — Z808 Family history of malignant neoplasm of other organs or systems: Secondary | ICD-10-CM | POA: Diagnosis not present

## 2022-09-06 NOTE — Progress Notes (Signed)
Symptom Management Consult Note [facility]    Patient Care Team: Patient, No Pcp Per as PCP - General (General Practice) Serena Croissant, MD as Consulting Physician (Hematology and Oncology) Dorothy Puffer, MD as Consulting Physician (Radiation Oncology) Griselda Miner, MD as Consulting Physician (General Surgery)    Name / MRN / DOBBabbette Potter  [id number]  04/15/1960   Date of visit: 09/06/2022   Chief Complaint/Reason for visit: recheck breast redness   Current Therapy: Anastrozole     ASSESSMENT & PLAN: Patient is a 62 y.o. female  with oncologic history of left breast cancer, ER positive followed by Dr. Pamelia Hoit.  I have viewed most recent oncology note and lab work.    #Left breast cancer, ER positive -s/p lumpectomy and radiation in 2022 - Next appointment with oncologist is 05/06/2023   #Breast erythema -Resolved.  -Patient missed a few doses of keflex, has still had very positive outcome -No additional work up needed. Patient will RTC if symptoms return.       Heme/Onc History: Oncology History  Malignant neoplasm of upper-outer quadrant of left breast in female, estrogen receptor positive (HCC)  03/21/2020 Initial Diagnosis   Screening mammogram detected left breast calcifications. Diagnostic mammogram on 03/02/20 showed two 0.6cm groups of calcifications in the left breast, spanning 2.0cm in total. Biopsy on 03/21/20 showed invasive mammary carcinoma with lobular and ductal features, HER-2 negative (0), ER+ 40% weak, PR+ 40% moderate, Ki67 10%.   03/21/2020 Cancer Staging   Staging form: Breast, AJCC 8th Edition - Clinical stage from 03/21/2020: Stage IA (cT1b, cN0, cM0, G2, ER+, PR+, HER2-) - Signed by Loa Socks, NP on 10/18/2020 Stage prefix: Initial diagnosis Histologic grading system: 3 grade system   04/24/2020 Surgery   Left lumpectomy Carolynne Edouard): no residual invasive carcinoma, LCIS, and 3 left axillary lymph nodes negative for  carcinoma.   04/24/2020 Cancer Staging   Staging form: Breast, AJCC 8th Edition - Pathologic stage from 04/24/2020: pT0, pN0, cM0 - Signed by Loa Socks, NP on 10/18/2020 Stage prefix: Initial diagnosis   05/11/2020 Genetic Testing   Negative genetic testing.  CDC73 VUS identified.  The CancerNext-Expanded gene panel offered by [facility] and includes sequencing and rearrangement analysis for the following 77 genes: AIP, ALK, APC*, ATM*, AXIN2, BAP1, BARD1, BLM, BMPR1A, BRCA1*, BRCA2*, BRIP1*, CDC73, CDH1*, CDK4, CDKN1B, CDKN2A, CHEK2*, CTNNA1, DICER1, FANCC, FH, FLCN, GALNT12, KIF1B, LZTR1, MAX, MEN1, MET, MLH1*, MSH2*, MSH3, MSH6*, MUTYH*, NBN, NF1*, NF2, NTHL1, PALB2*, PHOX2B, PMS2*, POT1, PRKAR1A, PTCH1, PTEN*, RAD51C*, RAD51D*, RB1, RECQL, RET, SDHA, SDHAF2, SDHB, SDHC, SDHD, SMAD4, SMARCA4, SMARCB1, SMARCE1, STK11, SUFU, TMEM127, TP53*, TSC1, TSC2, VHL and XRCC2 (sequencing and deletion/duplication); EGFR, EGLN1, HOXB13, KIT, MITF, PDGFRA, POLD1, and POLE (sequencing only); EPCAM and GREM1 (deletion/duplication only). DNA and RNA analyses performed for * genes. The report date is May 11, 2020.   06/01/2020 - 06/29/2020 Radiation Therapy   Adjuvant radiation  06/01/2020 through 06/29/2020 Site Technique Total Dose (Gy) Dose per Fx (Gy) Completed Fx Beam Energies  Breast, Left: Breast_Lt 3D 42.56/42.56 2.66 16/16 6X  Breast, Left: Breast_Lt_Bst 3D 8/8 2 4/4 6X    07/16/2020 -  Anti-estrogen oral therapy   Anastrozole       Interval history-: Nicole Potter is a 62 y.o. female with oncologic history as above presenting to [facility] today with chief complaint of recheck of left breast redness.  She presents unaccompanied to clinic.  Patient states her symptoms have nearly resolved.  After her first dose of Keflex she noticed that the redness was already decreasing.  She has missed a few doses of the Keflex due to being busy with work.  She has not had any fevers chills, or breast pain.  She admits to feeling like her usual self. She did have her second infusion of Avsola this week and tolerated it well.      ROS  All other systems are reviewed and are negative for acute change except as noted in the HPI.    No Known Allergies   Past Medical History:  Diagnosis Date   Anxiety    Breast cancer (HCC) 03/21/2020   left breast INVASIVE MAMMARY CA   Family history of adverse reaction to anesthesia    mother is sensitive to anesthesia   Family history of prostate cancer    Family history of thyroid cancer    Hypothyroidism    Personal history of radiation therapy      Past Surgical History:  Procedure Laterality Date   BREAST BIOPSY Left 03/21/2020   BREAST LUMPECTOMY Left 04/24/2020   BREAST LUMPECTOMY WITH RADIOACTIVE SEED AND SENTINEL LYMPH NODE BIOPSY Left 04/24/2020   Procedure: LEFT BREAST LUMPECTOMY  WITH RADIOACTIVE SEEDS X 2  AND LEFT AXILLARY SENTINEL LYMPH NODE BIOPSY X 3;  Surgeon: Griselda Miner, MD;  Location:  SUR[facility]ERY C[facility];  Service: General;  Laterality: Left;   KNEE SURGERY Left 1980   TUBAL LIGATION  2004    Social History   Socioeconomic History   Marital status: Unknown    Spouse name: Not on file   Number of children: Not on file   Years of education: Not on file   Highest education level: Not on file  Occupational History   Not on file  Tobacco Use   Smoking status: Never   Smokeless tobacco: Never  Vaping Use   Vaping Use: Never used  Substance and Sexual Activity   Alcohol use: Yes    Comment: social   Drug use: Never   Sexual activity: Not on file  Other Topics Concern   Not on file  Social History Narrative   Not on file   Social Determinants of Health   Financial Resource Strain: Not on file  Food Insecurity: Not on file  Transportation Needs: Not on file  Physical Activity: Not on file  Stress: Not on file  Social Connections: Not on file  Intimate Partner Violence: Not At Risk (05/23/2020)    Humiliation, Afraid, Rape, and Kick questionnaire    Fear of Current or Ex-Partner: No    Emotionally Abused: No    Physically Abused: No    Sexually Abused: No    Family History  Problem Relation Age of Onset   Thyroid cancer Mother    Prostate cancer Father    Congestive Heart Failure Father    Skin cancer Father    Thyroid cancer Maternal Aunt 60   Throat cancer Paternal Grandfather    Cancer Cousin        unknown cancer - mat first cousin     Current Outpatient Medications:    Adalimumab (HUMIRA, 2 PEN,) 40 MG/0.4ML PNKT, as directed Subcutaneous subcutaneous every other week for 30 days, Disp: , Rfl:    ALPRAZolam (XANAX) 0.5 MG tablet, Take 0.5 mg by mouth daily as needed., Disp: , Rfl:    anastrozole (ARIMIDEX) 1 MG tablet, Take 1 tablet (1 mg total) by mouth daily., Disp: 90 tablet, Rfl: 3  cephALEXin (KEFLEX) 500 MG capsule, Take 2 capsules (1,000 mg total) by mouth every 6 (six) hours for 2 days, THEN 1 capsule (500 mg total) every 6 (six) hours for 8 days., Disp: 48 capsule, Rfl: 0   inFLIXimab-axxq (AVSOLA IV), , Disp: , Rfl:    MOUNJARO 15 MG/0.5ML Pen, Inject into the skin., Disp: , Rfl:    Multiple Vitamin (MULTIVITAMIN WITH MINERALS) TABS tablet, Take 1 tablet by mouth daily., Disp: , Rfl:    NP THYROID 90 MG tablet, Take 90 mg by mouth at bedtime., Disp: , Rfl:    phentermine 30 MG capsule, Take 30 mg by mouth daily as needed (weightloss)., Disp: , Rfl:    predniSONE (DELTASONE) 10 MG tablet, Take 10 mg by mouth daily., Disp: , Rfl:   PHYSICAL EXAM: ECOG FS:0 - Asymptomatic    Vitals:   09/06/22 0828  BP: 121/62  Pulse: 76  Resp: 17  Temp: 97.8 F (36.6 C)  TempSrc: Oral  SpO2: 100%  Weight: 178 lb 1 oz (80.8 kg)  Height: 5\' 9"  (1.753 m)   Physical Exam Vitals and nursing note reviewed.  Constitutional:      Appearance: She is not ill-appearing or toxic-appearing.  HENT:     Head: Normocephalic.  Eyes:     Conjunctiva/sclera: Conjunctivae  normal.  Cardiovascular:     Rate and Rhythm: Normal rate.  Pulmonary:     Effort: Pulmonary effort is normal.  Chest:     Comments: Left breast with redness.  Abdominal:     General: There is no distension.  Musculoskeletal:     Cervical back: Normal range of motion.  Skin:    General: Skin is warm and dry.  Neurological:     Mental Status: She is alert.        LABORATORY DATA: I have reviewed the data as listed    Latest Ref Rng & Units 07/05/2020    7:29 PM  CBC  WBC 4.0 - 10.5 K/uL 9.0   Hemoglobin 12.0 - 15.0 g/dL 78.4   Hematocrit 69.6 - 46.0 % 45.3   Platelets 150 - 400 K/uL 348         Latest Ref Rng & Units 07/05/2020    7:29 PM  CMP  Glucose 70 - 99 mg/dL 98   BUN 6 - 20 mg/dL 21   Creatinine 2.95 - 1.00 mg/dL 2.84   Sodium 132 - 440 mmol/L 142   Potassium 3.5 - 5.1 mmol/L 3.8   Chloride 98 - 111 mmol/L 105   CO2 22 - 32 mmol/L 29   Calcium 8.9 - 10.3 mg/dL 10.2   Total Protein 6.5 - 8.1 g/dL 8.5   Total Bilirubin 0.3 - 1.2 mg/dL 0.8   Alkaline Phos 38 - 126 U/L 104   AST 15 - 41 U/L 35   ALT 0 - 44 U/L 26        RADIOGRAPHIC STUDIES (from last 24 hours if applicable) I have personally reviewed the radiological images as listed and agreed with the findings in the report. No results found.      Visit Diagnosis: 1. Malignant neoplasm of upper-outer quadrant of left breast in female, estrogen receptor positive (HCC)   2. Breast erythema      No orders of the defined types were placed in this encounter.   All questions were answered. The patient knows to call the clinic with any problems, questions or concerns. No barriers to learning was detected.  A total  of more than 10 minutes were spent on this encounter with face-to-face time and non-face-to-face time, including preparing to see the patient, counseling the patient and coordination of care as outlined above.    Thank you for allowing me to participate in the care of this patient.     Shanon Ace, PA-C Department of Hematology/Oncology [facility] at [facility] Phone: [phone]  [phone]    09/06/2022 12:26 PM

## 2022-09-17 DIAGNOSIS — K529 Noninfective gastroenteritis and colitis, unspecified: Secondary | ICD-10-CM | POA: Diagnosis not present

## 2022-10-02 DIAGNOSIS — K529 Noninfective gastroenteritis and colitis, unspecified: Secondary | ICD-10-CM | POA: Diagnosis not present

## 2022-10-15 DIAGNOSIS — A09 Infectious gastroenteritis and colitis, unspecified: Secondary | ICD-10-CM | POA: Diagnosis not present

## 2022-10-15 DIAGNOSIS — K5100 Ulcerative (chronic) pancolitis without complications: Secondary | ICD-10-CM | POA: Diagnosis not present

## 2022-10-28 DIAGNOSIS — K5100 Ulcerative (chronic) pancolitis without complications: Secondary | ICD-10-CM | POA: Diagnosis not present

## 2022-10-29 DIAGNOSIS — K5100 Ulcerative (chronic) pancolitis without complications: Secondary | ICD-10-CM | POA: Diagnosis not present

## 2022-11-01 DIAGNOSIS — K529 Noninfective gastroenteritis and colitis, unspecified: Secondary | ICD-10-CM | POA: Diagnosis not present

## 2022-11-29 DIAGNOSIS — K5190 Ulcerative colitis, unspecified, without complications: Secondary | ICD-10-CM | POA: Diagnosis not present

## 2022-12-06 DIAGNOSIS — K5100 Ulcerative (chronic) pancolitis without complications: Secondary | ICD-10-CM | POA: Diagnosis not present

## 2023-01-13 ENCOUNTER — Encounter: Payer: Self-pay | Admitting: Genetic Counselor

## 2023-01-17 DIAGNOSIS — K5100 Ulcerative (chronic) pancolitis without complications: Secondary | ICD-10-CM | POA: Diagnosis not present

## 2023-02-28 ENCOUNTER — Other Ambulatory Visit: Payer: Self-pay | Admitting: Obstetrics and Gynecology

## 2023-02-28 DIAGNOSIS — Z1231 Encounter for screening mammogram for malignant neoplasm of breast: Secondary | ICD-10-CM

## 2023-03-17 DIAGNOSIS — K5190 Ulcerative colitis, unspecified, without complications: Secondary | ICD-10-CM | POA: Diagnosis not present

## 2023-03-17 DIAGNOSIS — K5100 Ulcerative (chronic) pancolitis without complications: Secondary | ICD-10-CM | POA: Diagnosis not present

## 2023-03-26 DIAGNOSIS — L659 Nonscarring hair loss, unspecified: Secondary | ICD-10-CM | POA: Diagnosis not present

## 2023-03-26 DIAGNOSIS — R202 Paresthesia of skin: Secondary | ICD-10-CM | POA: Diagnosis not present

## 2023-03-26 DIAGNOSIS — K5100 Ulcerative (chronic) pancolitis without complications: Secondary | ICD-10-CM | POA: Diagnosis not present

## 2023-03-31 ENCOUNTER — Ambulatory Visit
Admission: RE | Admit: 2023-03-31 | Discharge: 2023-03-31 | Disposition: A | Discharge status: Home / Self Care | Payer: BC Managed Care – PPO | Source: Ambulatory Visit | Attending: Obstetrics and Gynecology | Admitting: Obstetrics and Gynecology

## 2023-03-31 DIAGNOSIS — Z1231 Encounter for screening mammogram for malignant neoplasm of breast: Secondary | ICD-10-CM | POA: Diagnosis not present

## 2023-04-11 DIAGNOSIS — Z0000 Encounter for general adult medical examination without abnormal findings: Secondary | ICD-10-CM | POA: Diagnosis not present

## 2023-04-11 DIAGNOSIS — Z13220 Encounter for screening for lipoid disorders: Secondary | ICD-10-CM | POA: Diagnosis not present

## 2023-04-11 DIAGNOSIS — Z13228 Encounter for screening for other metabolic disorders: Secondary | ICD-10-CM | POA: Diagnosis not present

## 2023-04-11 DIAGNOSIS — Z131 Encounter for screening for diabetes mellitus: Secondary | ICD-10-CM | POA: Diagnosis not present

## 2023-04-11 DIAGNOSIS — Z23 Encounter for immunization: Secondary | ICD-10-CM | POA: Diagnosis not present

## 2023-04-16 DIAGNOSIS — M25561 Pain in right knee: Secondary | ICD-10-CM | POA: Diagnosis not present

## 2023-04-25 DIAGNOSIS — Z23 Encounter for immunization: Secondary | ICD-10-CM | POA: Diagnosis not present

## 2023-05-01 DIAGNOSIS — Z1331 Encounter for screening for depression: Secondary | ICD-10-CM | POA: Diagnosis not present

## 2023-05-01 DIAGNOSIS — Z01419 Encounter for gynecological examination (general) (routine) without abnormal findings: Secondary | ICD-10-CM | POA: Diagnosis not present

## 2023-05-01 DIAGNOSIS — Z124 Encounter for screening for malignant neoplasm of cervix: Secondary | ICD-10-CM | POA: Diagnosis not present

## 2023-05-01 DIAGNOSIS — Z01411 Encounter for gynecological examination (general) (routine) with abnormal findings: Secondary | ICD-10-CM | POA: Diagnosis not present

## 2023-05-05 NOTE — Assessment & Plan Note (Signed)
03/21/2020:Screening mammogram detected left breast calcifications. Diagnostic mammogram on 03/02/20 showed two 0.6cm groups of calcifications in the left breast, spanning 2.0cm in total. Biopsy on 03/21/20 showed invasive mammary carcinoma with lobular and ductal features, HER-2 negative (0), ER+ 40% weak, PR+ 40% moderate, Ki67 10%.   04/24/20: Left lumpectomy Carolynne Edouard): no residual invasive carcinoma, LCIS, and 3 left axillary lymph nodes negative for carcinoma. (Size 2 mm on biopsy)   Adjuvant radiation to be completed 06/29/2020   Anastrozole Toxicities: Tolerating anastrozole extremely well without any problems or concerns.   Denies any joint stiffness or achiness.   She lost 35 pounds by using weight loss medication Monjuro   Breast Cancer Surveillance: 1. Breast Exam: 05/06/2023: Benign 2. Mammogram: 03/31/2023: Benign, Density B   She was diagnosed with ulcerative colitis last year and she is currently on Humira.   RTC in 1 year

## 2023-05-06 ENCOUNTER — Inpatient Hospital Stay: Payer: BC Managed Care – PPO | Attending: Hematology and Oncology | Admitting: Hematology and Oncology

## 2023-05-06 VITALS — BP 111/83 | HR 81 | Temp 98.7°F | Resp 18 | Ht 69.0 in | Wt 174.9 lb

## 2023-05-06 DIAGNOSIS — Z780 Asymptomatic menopausal state: Secondary | ICD-10-CM | POA: Diagnosis not present

## 2023-05-06 DIAGNOSIS — C50412 Malignant neoplasm of upper-outer quadrant of left female breast: Secondary | ICD-10-CM | POA: Insufficient documentation

## 2023-05-06 DIAGNOSIS — M8580 Other specified disorders of bone density and structure, unspecified site: Secondary | ICD-10-CM | POA: Diagnosis not present

## 2023-05-06 DIAGNOSIS — Z170 Estrogen receptor positive status [ER+]: Secondary | ICD-10-CM | POA: Insufficient documentation

## 2023-05-06 DIAGNOSIS — Z923 Personal history of irradiation: Secondary | ICD-10-CM | POA: Diagnosis not present

## 2023-05-06 DIAGNOSIS — Z79811 Long term (current) use of aromatase inhibitors: Secondary | ICD-10-CM | POA: Insufficient documentation

## 2023-05-06 DIAGNOSIS — Z1732 Human epidermal growth factor receptor 2 negative status: Secondary | ICD-10-CM | POA: Insufficient documentation

## 2023-05-06 DIAGNOSIS — Z1721 Progesterone receptor positive status: Secondary | ICD-10-CM | POA: Diagnosis not present

## 2023-05-06 MED ORDER — ANASTROZOLE 1 MG PO TABS: 1.0000 mg | ORAL_TABLET | Freq: Every day | ORAL | 3 refills | Status: AC

## 2023-05-06 MED ORDER — STELARA 90 MG/ML ~~LOC~~ SOSY: 90.0000 mg | PREFILLED_SYRINGE | SUBCUTANEOUS | Status: AC

## 2023-05-06 NOTE — Progress Notes (Signed)
Patient Care Team: Patient, No Pcp Per as PCP - General (General Practice) Serena Croissant, MD as Consulting Physician (Hematology and Oncology) Dorothy Puffer, MD as Consulting Physician (Radiation Oncology) Griselda Miner, MD as Consulting Physician (General Surgery)  DIAGNOSIS:  Encounter Diagnoses  Name Primary?   Malignant neoplasm of upper-outer quadrant of left breast in female, estrogen receptor positive (HCC) Yes   Post-menopausal     SUMMARY OF ONCOLOGIC HISTORY: Oncology History  Malignant neoplasm of upper-outer quadrant of left breast in female, estrogen receptor positive (HCC)  03/21/2020 Initial Diagnosis   Screening mammogram detected left breast calcifications. Diagnostic mammogram on 03/02/20 showed two 0.6cm groups of calcifications in the left breast, spanning 2.0cm in total. Biopsy on 03/21/20 showed invasive mammary carcinoma with lobular and ductal features, HER-2 negative (0), ER+ 40% weak, PR+ 40% moderate, Ki67 10%.   03/21/2020 Cancer Staging   Staging form: Breast, AJCC 8th Edition - Clinical stage from 03/21/2020: Stage IA (cT1b, cN0, cM0, G2, ER+, PR+, HER2-) - Signed by Loa Socks, NP on 10/18/2020 Stage prefix: Initial diagnosis Histologic grading system: 3 grade system   04/24/2020 Surgery   Left lumpectomy Carolynne Edouard): no residual invasive carcinoma, LCIS, and 3 left axillary lymph nodes negative for carcinoma.   04/24/2020 Cancer Staging   Staging form: Breast, AJCC 8th Edition - Pathologic stage from 04/24/2020: pT0, pN0, cM0 - Signed by Loa Socks, NP on 10/18/2020 Stage prefix: Initial diagnosis   05/11/2020 Genetic Testing   Negative genetic testing.  CDC73 VUS identified.  The CancerNext-Expanded gene panel offered by [facility] and includes sequencing and rearrangement analysis for the following 77 genes: AIP, ALK, APC*, ATM*, AXIN2, BAP1, BARD1, BLM, BMPR1A, BRCA1*, BRCA2*, BRIP1*, CDC73, CDH1*, CDK4, CDKN1B, CDKN2A, CHEK2*, CTNNA1,  DICER1, FANCC, FH, FLCN, GALNT12, KIF1B, LZTR1, MAX, MEN1, MET, MLH1*, MSH2*, MSH3, MSH6*, MUTYH*, NBN, NF1*, NF2, NTHL1, PALB2*, PHOX2B, PMS2*, POT1, PRKAR1A, PTCH1, PTEN*, RAD51C*, RAD51D*, RB1, RECQL, RET, SDHA, SDHAF2, SDHB, SDHC, SDHD, SMAD4, SMARCA4, SMARCB1, SMARCE1, STK11, SUFU, TMEM127, TP53*, TSC1, TSC2, VHL and XRCC2 (sequencing and deletion/duplication); EGFR, EGLN1, HOXB13, KIT, MITF, PDGFRA, POLD1, and POLE (sequencing only); EPCAM and GREM1 (deletion/duplication only). DNA and RNA analyses performed for * genes. The report date is May 11, 2020.  UPDATE: CDC73 c.[id number] VUS has been reclassified to Likely Benign.  The amended report date is 12/05/2022.   06/01/2020 - 06/29/2020 Radiation Therapy   Adjuvant radiation  06/01/2020 through 06/29/2020 Site Technique Total Dose (Gy) Dose per Fx (Gy) Completed Fx Beam Energies  Breast, Left: Breast_Lt 3D 42.56/42.56 2.66 16/16 6X  Breast, Left: Breast_Lt_Bst 3D 8/8 2 4/4 6X     07/16/2020 -  Anti-estrogen oral therapy   Anastrozole     CHIEF COMPLIANT: Follow-up on anastrozole  HISTORY OF PRESENT ILLNESS:   History of Present Illness   Nicole Potter is a 63 year old female with breast cancer who presents for follow-up care.  She has been managing her breast cancer since 2022 and is currently on anastrozole, which she tolerates well. She experiences minimal side effects, including joint stiffness in her knee, attributed to arthritis. Last year, she had an episode of breast engorgement, swelling, and warmth at the site of previous radiation treatment, which resolved spontaneously and was possibly related to radiation recall. No hot flashes or breast pain are reported.  She experiences persistent knee pain, diagnosed as arthritis, which has limited her physical activity. She is exploring further treatment options to address the knee pain.  Her  bone health is under surveillance due to a bone density scan in 2022 showing osteopenia. She is  due for a follow-up scan soon to monitor any changes.  She has lost significant weight, from 307 pounds to 174.5 pounds, primarily through fasting. Despite the weight loss, she has not engaged in regular exercise, partly due to knee pain.         ALLERGIES:  has no known allergies.  MEDICATIONS:  Current Outpatient Medications  Medication Sig Dispense Refill   ustekinumab (STELARA) 90 MG/ML SOSY injection Inject 1 mL (90 mg total) into the skin every 8 (eight) weeks.     ALPRAZolam (XANAX) 0.5 MG tablet Take 0.5 mg by mouth daily as needed.     anastrozole (ARIMIDEX) 1 MG tablet Take 1 tablet (1 mg total) by mouth daily. 90 tablet 3   inFLIXimab-axxq (AVSOLA IV)      MOUNJARO 15 MG/0.5ML Pen Inject into the skin.     Multiple Vitamin (MULTIVITAMIN WITH MINERALS) TABS tablet Take 1 tablet by mouth daily.     NP THYROID 90 MG tablet Take 90 mg by mouth at bedtime.     No current facility-administered medications for this visit.    PHYSICAL EXAMINATION: ECOG PERFORMANCE STATUS: 1 - Symptomatic but completely ambulatory  Vitals:   05/06/23 0832 05/06/23 0834  BP: (!) 115/43 111/83  Pulse: 81   Resp: 18   Temp: 98.7 F (37.1 C)   SpO2: 100%    Filed Weights   05/06/23 0832  Weight: 174 lb 14.4 oz (79.3 kg)    Physical Exam no palpable lumps or nodules in bilateral breasts or axilla        (exam performed in the presence of a chaperone)  LABORATORY DATA:  I have reviewed the data as listed    Latest Ref Rng & Units 07/05/2020    7:29 PM  CMP  Glucose 70 - 99 mg/dL 98   BUN 6 - 20 mg/dL 21   Creatinine 1.47 - 1.00 mg/dL 8.29   Sodium 562 - 130 mmol/L 142   Potassium 3.5 - 5.1 mmol/L 3.8   Chloride 98 - 111 mmol/L 105   CO2 22 - 32 mmol/L 29   Calcium 8.9 - 10.3 mg/dL 86.5   Total Protein 6.5 - 8.1 g/dL 8.5   Total Bilirubin 0.3 - 1.2 mg/dL 0.8   Alkaline Phos 38 - 126 U/L 104   AST 15 - 41 U/L 35   ALT 0 - 44 U/L 26     Lab Results  Component Value Date    WBC 9.0 07/05/2020   HGB 15.0 07/05/2020   HCT 45.3 07/05/2020   MCV 91.7 07/05/2020   PLT 348 07/05/2020   NEUTROABS 7.4 07/05/2020    ASSESSMENT & PLAN:  Malignant neoplasm of upper-outer quadrant of left breast in female, estrogen receptor positive (HCC) 03/21/2020:Screening mammogram detected left breast calcifications. Diagnostic mammogram on 03/02/20 showed two 0.6cm groups of calcifications in the left breast, spanning 2.0cm in total. Biopsy on 03/21/20 showed invasive mammary carcinoma with lobular and ductal features, HER-2 negative (0), ER+ 40% weak, PR+ 40% moderate, Ki67 10%.   04/24/20: Left lumpectomy Carolynne Edouard): no residual invasive carcinoma, LCIS, and 3 left axillary lymph nodes negative for carcinoma. (Size 2 mm on biopsy)   Adjuvant radiation to be completed 06/29/2020   Anastrozole Toxicities: Tolerating anastrozole extremely well without any problems or concerns.   Denies any joint stiffness or achiness.   She lost 35 pounds by  using weight loss medication Monjuro   Breast Cancer Surveillance: 1. Breast Exam: 05/06/2023: Benign 2. Mammogram: 03/31/2023: Benign, Density B   She was diagnosed with ulcerative colitis last year and she is currently on Humira.   RTC in 1 year  ------------------------------------- Assessment and Plan    Breast Cancer (Post-Treatment) Three years post-diagnosis, currently on anastrozole with minimal side effects, except for knee pain attributed to arthritis. Past episode of breast engorgement and swelling resolved, possibly related to radiation recall. - Continue anastrozole - Monitor for new symptoms or side effects  Knee Osteoarthritis Persistent knee pain for several months, diagnosed with arthritis. Scheduled to see a sports medicine specialist for potential interventions, including injections. Knee pain has limited her ability to exercise, which is important for maintaining muscle mass during cancer treatment. - Refer to sports medicine  for knee pain management - Consider injections for knee arthritis - Encourage regular physical activity post knee pain management - Discuss potential enjoyable physical activities to maintain muscle mass  Osteopenia Bone density scan in 2022 showed osteopenia. Due for a follow-up bone density scan at [facility] due to better availability. - Order bone density scan at the [facility] within the next month or two  Follow-up - Ensure patient is contacted for bone density scan scheduling.          Orders Placed This Encounter  Procedures   DG Bone Density    Standing Status:   Future    Expected Date:   06/03/2023    Expiration Date:   05/05/2024    Reason for Exam (SYMPTOM  OR DIAGNOSIS REQUIRED):   post menopausal    Is patient pregnant?:   No    Preferred imaging location?:   [facility]    Release to patient:   Immediate   The patient has a good understanding of the overall plan. she agrees with it. she will call with any problems that may develop before the next visit here. Total time spent: 30 mins including face to face time and time spent for planning, charting and co-ordination of care   Tamsen Meek, MD 05/06/23

## 2023-05-09 ENCOUNTER — Ambulatory Visit (HOSPITAL_BASED_OUTPATIENT_CLINIC_OR_DEPARTMENT_OTHER)
Admission: RE | Admit: 2023-05-09 | Discharge: 2023-05-09 | Disposition: A | Discharge status: Home / Self Care | Payer: BC Managed Care – PPO | Source: Ambulatory Visit | Attending: Hematology and Oncology | Admitting: Hematology and Oncology

## 2023-05-09 DIAGNOSIS — Z780 Asymptomatic menopausal state: Secondary | ICD-10-CM

## 2023-05-13 ENCOUNTER — Telehealth: Payer: Self-pay | Admitting: Hematology and Oncology

## 2023-05-13 DIAGNOSIS — M25561 Pain in right knee: Secondary | ICD-10-CM | POA: Diagnosis not present

## 2023-05-13 MED ORDER — ALENDRONATE SODIUM 70 MG PO TABS: 70.0000 mg | ORAL_TABLET | ORAL | 3 refills | Status: DC

## 2023-05-13 NOTE — Telephone Encounter (Signed)
 I informed her that the bone density showed a T-score of -2.9 which falls in the osteoporosis category.  I recommended that she take bisphosphonate like Fosamax once a week along with calcium and vitamin D.  I sent a new prescription to her pharmacy after explaining the risks and benefits of Fosamax.

## 2023-08-08 DIAGNOSIS — M79644 Pain in right finger(s): Secondary | ICD-10-CM | POA: Diagnosis not present

## 2023-08-13 DIAGNOSIS — M25561 Pain in right knee: Secondary | ICD-10-CM | POA: Diagnosis not present

## 2023-08-21 DIAGNOSIS — M25561 Pain in right knee: Secondary | ICD-10-CM | POA: Diagnosis not present

## 2023-08-21 DIAGNOSIS — M25661 Stiffness of right knee, not elsewhere classified: Secondary | ICD-10-CM | POA: Diagnosis not present

## 2023-08-21 DIAGNOSIS — M25461 Effusion, right knee: Secondary | ICD-10-CM | POA: Diagnosis not present

## 2023-08-21 DIAGNOSIS — M6281 Muscle weakness (generalized): Secondary | ICD-10-CM | POA: Diagnosis not present

## 2023-08-29 DIAGNOSIS — M25461 Effusion, right knee: Secondary | ICD-10-CM | POA: Diagnosis not present

## 2023-08-29 DIAGNOSIS — M25561 Pain in right knee: Secondary | ICD-10-CM | POA: Diagnosis not present

## 2023-08-29 DIAGNOSIS — M6281 Muscle weakness (generalized): Secondary | ICD-10-CM | POA: Diagnosis not present

## 2023-08-29 DIAGNOSIS — M25661 Stiffness of right knee, not elsewhere classified: Secondary | ICD-10-CM | POA: Diagnosis not present

## 2023-09-01 DIAGNOSIS — K5100 Ulcerative (chronic) pancolitis without complications: Secondary | ICD-10-CM | POA: Diagnosis not present

## 2023-09-04 DIAGNOSIS — M25561 Pain in right knee: Secondary | ICD-10-CM | POA: Diagnosis not present

## 2023-09-04 DIAGNOSIS — M25661 Stiffness of right knee, not elsewhere classified: Secondary | ICD-10-CM | POA: Diagnosis not present

## 2023-09-04 DIAGNOSIS — M6281 Muscle weakness (generalized): Secondary | ICD-10-CM | POA: Diagnosis not present

## 2023-09-04 DIAGNOSIS — M25461 Effusion, right knee: Secondary | ICD-10-CM | POA: Diagnosis not present

## 2023-09-05 DIAGNOSIS — M79644 Pain in right finger(s): Secondary | ICD-10-CM | POA: Diagnosis not present

## 2023-09-12 DIAGNOSIS — M25661 Stiffness of right knee, not elsewhere classified: Secondary | ICD-10-CM | POA: Diagnosis not present

## 2023-09-12 DIAGNOSIS — M25461 Effusion, right knee: Secondary | ICD-10-CM | POA: Diagnosis not present

## 2023-09-12 DIAGNOSIS — M25561 Pain in right knee: Secondary | ICD-10-CM | POA: Diagnosis not present

## 2023-09-12 DIAGNOSIS — M6281 Muscle weakness (generalized): Secondary | ICD-10-CM | POA: Diagnosis not present

## 2023-09-16 DIAGNOSIS — M25461 Effusion, right knee: Secondary | ICD-10-CM | POA: Diagnosis not present

## 2023-09-16 DIAGNOSIS — M25661 Stiffness of right knee, not elsewhere classified: Secondary | ICD-10-CM | POA: Diagnosis not present

## 2023-09-16 DIAGNOSIS — M6281 Muscle weakness (generalized): Secondary | ICD-10-CM | POA: Diagnosis not present

## 2023-09-16 DIAGNOSIS — M25561 Pain in right knee: Secondary | ICD-10-CM | POA: Diagnosis not present

## 2023-10-08 DIAGNOSIS — M25561 Pain in right knee: Secondary | ICD-10-CM | POA: Diagnosis not present

## 2023-10-13 DIAGNOSIS — K5100 Ulcerative (chronic) pancolitis without complications: Secondary | ICD-10-CM | POA: Diagnosis not present

## 2023-10-13 DIAGNOSIS — R197 Diarrhea, unspecified: Secondary | ICD-10-CM | POA: Diagnosis not present

## 2023-10-13 DIAGNOSIS — R1084 Generalized abdominal pain: Secondary | ICD-10-CM | POA: Diagnosis not present

## 2024-02-23 DIAGNOSIS — K6389 Other specified diseases of intestine: Secondary | ICD-10-CM | POA: Diagnosis not present

## 2024-02-23 DIAGNOSIS — K5730 Diverticulosis of large intestine without perforation or abscess without bleeding: Secondary | ICD-10-CM | POA: Diagnosis not present

## 2024-02-23 DIAGNOSIS — K5289 Other specified noninfective gastroenteritis and colitis: Secondary | ICD-10-CM | POA: Diagnosis not present

## 2024-02-23 DIAGNOSIS — K640 First degree hemorrhoids: Secondary | ICD-10-CM | POA: Diagnosis not present

## 2024-02-23 DIAGNOSIS — K523 Indeterminate colitis: Secondary | ICD-10-CM | POA: Diagnosis not present

## 2024-02-23 DIAGNOSIS — K5190 Ulcerative colitis, unspecified, without complications: Secondary | ICD-10-CM | POA: Diagnosis not present

## 2024-03-05 DIAGNOSIS — K5190 Ulcerative colitis, unspecified, without complications: Secondary | ICD-10-CM | POA: Diagnosis not present

## 2024-04-16 ENCOUNTER — Other Ambulatory Visit: Payer: Self-pay | Admitting: Hematology and Oncology

## 2024-05-06 ENCOUNTER — Ambulatory Visit: Payer: BC Managed Care – PPO | Admitting: Hematology and Oncology
